# Patient Record
Sex: Female | Born: 1965 | Race: Black or African American | Hispanic: No | Smoking: Former smoker
Health system: Southern US, Community
[De-identification: ages and names within clinical notes are randomized; demographics above are authoritative.]

## PROBLEM LIST (undated history)

## (undated) DIAGNOSIS — I10 Essential (primary) hypertension: Secondary | ICD-10-CM

## (undated) DIAGNOSIS — M539 Dorsopathy, unspecified: Secondary | ICD-10-CM

## (undated) DIAGNOSIS — H8390 Unspecified disease of inner ear, unspecified ear: Secondary | ICD-10-CM

## (undated) DIAGNOSIS — T753XXA Motion sickness, initial encounter: Secondary | ICD-10-CM

## (undated) DIAGNOSIS — C801 Malignant (primary) neoplasm, unspecified: Secondary | ICD-10-CM

## (undated) HISTORY — DX: Malignant (primary) neoplasm, unspecified: C80.1

## (undated) HISTORY — PX: TUBAL LIGATION: SHX77

---

## 2006-01-31 ENCOUNTER — Emergency Department: Payer: Self-pay | Admitting: Emergency Medicine

## 2006-02-17 ENCOUNTER — Emergency Department: Payer: Self-pay | Admitting: Emergency Medicine

## 2006-09-03 ENCOUNTER — Emergency Department: Payer: Self-pay | Admitting: Emergency Medicine

## 2010-07-24 ENCOUNTER — Emergency Department: Payer: Self-pay | Admitting: Emergency Medicine

## 2012-12-07 ENCOUNTER — Emergency Department: Payer: Self-pay | Admitting: Emergency Medicine

## 2014-03-16 ENCOUNTER — Emergency Department: Payer: Self-pay | Admitting: Emergency Medicine

## 2014-03-16 LAB — BASIC METABOLIC PANEL
Anion Gap: 7 (ref 7–16)
BUN: 8 mg/dL (ref 7–18)
Calcium, Total: 8.9 mg/dL (ref 8.5–10.1)
Chloride: 106 mmol/L (ref 98–107)
Co2: 25 mmol/L (ref 21–32)
Creatinine: 0.8 mg/dL (ref 0.60–1.30)
EGFR (African American): >60
EGFR (Non-African Amer.): >60
Glucose: 135 mg/dL — ABNORMAL HIGH (ref 65–99)
Osmolality: 276 (ref 275–301)
Potassium: 3.6 mmol/L (ref 3.5–5.1)
Sodium: 138 mmol/L (ref 136–145)

## 2014-03-16 LAB — CBC
HCT: 39.2 % (ref 35.0–47.0)
HGB: 12.9 g/dL (ref 12.0–16.0)
MCH: 32.1 pg (ref 26.0–34.0)
MCHC: 33 g/dL (ref 32.0–36.0)
MCV: 97 fL (ref 80–100)
Platelet: 274 10*3/uL (ref 150–440)
RBC: 4.04 10*6/uL (ref 3.80–5.20)
RDW: 13.7 % (ref 11.5–14.5)
WBC: 7.4 10*3/uL (ref 3.6–11.0)

## 2015-01-30 ENCOUNTER — Ambulatory Visit
Admission: EM | Admit: 2015-01-30 | Discharge: 2015-01-30 | Disposition: A | Discharge status: Home / Self Care | Payer: Self-pay | Attending: Internal Medicine | Admitting: Internal Medicine

## 2015-01-30 DIAGNOSIS — Z202 Contact with and (suspected) exposure to infections with a predominantly sexual mode of transmission: Secondary | ICD-10-CM | POA: Insufficient documentation

## 2015-01-30 DIAGNOSIS — Z79899 Other long term (current) drug therapy: Secondary | ICD-10-CM | POA: Insufficient documentation

## 2015-01-30 DIAGNOSIS — I10 Essential (primary) hypertension: Secondary | ICD-10-CM | POA: Insufficient documentation

## 2015-01-30 DIAGNOSIS — F17200 Nicotine dependence, unspecified, uncomplicated: Secondary | ICD-10-CM | POA: Insufficient documentation

## 2015-01-30 DIAGNOSIS — Z0000 Encounter for general adult medical examination without abnormal findings: Secondary | ICD-10-CM

## 2015-01-30 DIAGNOSIS — Z8489 Family history of other specified conditions: Secondary | ICD-10-CM | POA: Insufficient documentation

## 2015-01-30 HISTORY — DX: Essential (primary) hypertension: I10

## 2015-01-30 HISTORY — DX: Unspecified disease of inner ear, unspecified ear: H83.90

## 2015-01-30 LAB — CHLAMYDIA/NGC RT PCR (ARMC ONLY)
Chlamydia Tr: NOT DETECTED
N gonorrhoeae: NOT DETECTED

## 2015-01-30 MED ORDER — AZITHROMYCIN 500 MG PO TABS: 1000.0000 mg | ORAL_TABLET | Freq: Once | ORAL | Status: DC

## 2015-01-30 MED ORDER — CEFTRIAXONE SODIUM 250 MG IJ SOLR: 250.0000 mg | Freq: Once | INTRAMUSCULAR | Status: DC

## 2015-01-30 NOTE — ED Notes (Signed)
States boyfriend told patient this morning that had penile discharge and "she needed to get checked". Patient denies any discharge, no symptoms.

## 2015-01-30 NOTE — Discharge Instructions (Signed)
Tests for gonorrhea and chlamydia (urine test) and hepatitis B, hepatitis C, HIV, syphilis (blood test) were done today.  Some of the results will take a few days to come back, others will be back in the next 24 hours.

## 2015-01-30 NOTE — ED Provider Notes (Signed)
CSN: [id number]     Arrival date & time 01/30/15  6606 History   First MD Initiated Contact with Patient 01/30/15 [phone]     Chief Complaint  Patient presents with  . Exposure to STD   HPI Patient is a 49 year old lady with no symptoms. She presents today with a history that her boyfriend has a penile discharge, and told her that she needed to be tested for STDs. No abdominal or pelvic pain, no dysuria, no urinary frequency. No unusual vaginal bleeding or discharge. No change in bowel movements. No rash/skin changes, no unusual joint pains or achiness. No fever  Past Medical History  Diagnosis Date  . Hypertension   . Inner ear disease    History reviewed. No pertinent past surgical history. Family History  Problem Relation Age of Onset  . Cirrhosis Father    History  Substance Use Topics  . Smoking status: Current Every Day Smoker  . Smokeless tobacco: Not on file  . Alcohol Use: Yes     Comment: socially   OB History    No data available     Review of Systems  All other systems reviewed and are negative.   Allergies  Review of patient's allergies indicates no known allergies.  Home Medications   Prior to Admission medications   Medication Sig Start Date End Date Taking? Authorizing Provider  lisinopril-hydrochlorothiazide (PRINZIDE,ZESTORETIC) 10-12.5 MG per tablet Take 1 tablet by mouth daily.   Yes Historical Provider, MD                 BP 185/104 mmHg  Pulse 94  Temp(Src) 98.2 F (36.8 C) (Oral)  Resp 16  Ht 5\' 2"  (1.575 m)  Wt 147 lb (66.679 kg)  BMI 26.88 kg/m2  SpO2 100%  LMP 01/25/2015 (Exact Date) Physical Exam  Constitutional: She is oriented to person, place, and time. No distress.  Alert, nicely groomed  HENT:  Head: Atraumatic.  Eyes:  Conjugate gaze, no eye redness/drainage  Neck: Neck supple.  Cardiovascular: Normal rate.   Pulmonary/Chest: No respiratory distress.  Abdominal: She exhibits no distension.  Musculoskeletal: Normal range  of motion.  No leg swelling  Neurological: She is alert and oriented to person, place, and time.  Skin: Skin is warm and dry.  No cyanosis  Nursing note and vitals reviewed.   ED Course  Procedures   Labs Review Labs Reviewed  CHLAMYDIA/NGC RT PCR ([facility] ONLY)  HIV ANTIBODY (ROUTINE TESTING)  HEPATITIS C ANTIBODY  RPR  HEPATITIS B SURFACE ANTIGEN        MDM   1. Possible exposure to STD     Results pending for GC/chlamydia, Hep B/C, rpr, HIV.    Sherlene Shams, MD 01/30/15 1057

## 2015-01-31 LAB — HIV ANTIBODY (ROUTINE TESTING W REFLEX): HIV Screen 4th Generation wRfx: NONREACTIVE

## 2015-01-31 LAB — RPR: RPR Ser Ql: NONREACTIVE

## 2015-01-31 LAB — HEPATITIS C ANTIBODY: HCV Ab: 0.2 s/co ratio (ref 0.0–0.9)

## 2015-01-31 LAB — HEPATITIS B SURFACE ANTIGEN: Hepatitis B Surface Ag: NEGATIVE

## 2015-06-06 ENCOUNTER — Ambulatory Visit: Payer: Self-pay | Admitting: Family Medicine

## 2017-01-28 ENCOUNTER — Other Ambulatory Visit: Payer: Self-pay | Admitting: Family Medicine

## 2017-01-28 DIAGNOSIS — N6459 Other signs and symptoms in breast: Secondary | ICD-10-CM

## 2017-02-06 ENCOUNTER — Ambulatory Visit
Admission: RE | Admit: 2017-02-06 | Discharge: 2017-02-06 | Disposition: A | Discharge status: Home / Self Care | Payer: BLUE CROSS/BLUE SHIELD | Source: Ambulatory Visit | Attending: Family Medicine | Admitting: Family Medicine

## 2017-02-06 ENCOUNTER — Other Ambulatory Visit: Payer: Self-pay

## 2017-02-06 DIAGNOSIS — N6459 Other signs and symptoms in breast: Secondary | ICD-10-CM | POA: Diagnosis present

## 2017-02-06 DIAGNOSIS — N63 Unspecified lump in unspecified breast: Secondary | ICD-10-CM | POA: Diagnosis not present

## 2017-02-06 DIAGNOSIS — R21 Rash and other nonspecific skin eruption: Secondary | ICD-10-CM | POA: Insufficient documentation

## 2017-10-06 DIAGNOSIS — I10 Essential (primary) hypertension: Secondary | ICD-10-CM | POA: Diagnosis not present

## 2018-09-01 ENCOUNTER — Emergency Department
Admission: EM | Admit: 2018-09-01 | Discharge: 2018-09-01 | Disposition: A | Discharge status: Home / Self Care | Payer: BLUE CROSS/BLUE SHIELD | Attending: Emergency Medicine | Admitting: Emergency Medicine

## 2018-09-01 ENCOUNTER — Encounter: Payer: Self-pay | Admitting: Emergency Medicine

## 2018-09-01 ENCOUNTER — Other Ambulatory Visit: Payer: Self-pay

## 2018-09-01 DIAGNOSIS — J069 Acute upper respiratory infection, unspecified: Secondary | ICD-10-CM | POA: Diagnosis not present

## 2018-09-01 DIAGNOSIS — I10 Essential (primary) hypertension: Secondary | ICD-10-CM | POA: Diagnosis not present

## 2018-09-01 DIAGNOSIS — Z79899 Other long term (current) drug therapy: Secondary | ICD-10-CM | POA: Insufficient documentation

## 2018-09-01 DIAGNOSIS — R0981 Nasal congestion: Secondary | ICD-10-CM | POA: Diagnosis not present

## 2018-09-01 DIAGNOSIS — Z87891 Personal history of nicotine dependence: Secondary | ICD-10-CM | POA: Diagnosis not present

## 2018-09-01 LAB — INFLUENZA PANEL BY PCR (TYPE A & B)
Influenza A By PCR: NEGATIVE
Influenza B By PCR: NEGATIVE

## 2018-09-01 MED ORDER — AMOXICILLIN 875 MG PO TABS: 875.0000 mg | ORAL_TABLET | Freq: Two times a day (BID) | ORAL | 0 refills | Status: DC

## 2018-09-01 MED ORDER — FLUTICASONE PROPIONATE 50 MCG/ACT NA SUSP: 2.0000 | Freq: Every day | NASAL | 2 refills | Status: DC

## 2018-09-01 MED ORDER — PREDNISONE 10 MG PO TABS: 30.0000 mg | ORAL_TABLET | Freq: Every day | ORAL | 0 refills | Status: DC

## 2018-09-01 NOTE — ED Notes (Signed)
See triage note  Presents with sinus pressure and drainage  States sx's started yesterday  Unsure of fever but has had body aches  Afebrile on arrival

## 2018-09-01 NOTE — ED Provider Notes (Signed)
[facility] Emergency Department Provider Note  ____________________________________________   First MD Initiated Contact with Patient 09/01/18 0715     (approximate)  I have reviewed the triage vital signs and the nursing notes.   HISTORY  Chief Complaint Nasal Congestion    HPI Tammy Garcia is a 53 y.o. femaleflulike symptoms, patient is complained of feeling hot , chills, body aches, cough, sore throat, states she has nausea but no vomiting, denies diarrhea; denies chest pain or sob.  Sx for 1 days.  Patient works at assisted living facility.  She states they just came off quarantine for norovirus.   Past Medical History:  Diagnosis Date  . Hypertension   . Inner ear disease     There are no active problems to display for this patient.   History reviewed. No pertinent surgical history.  Prior to Admission medications   Medication Sig Start Date End Date Taking? Authorizing Provider  atenolol (TENORMIN) 50 MG tablet Take 50 mg by mouth daily.   Yes [provider]  chlorthalidone (HYGROTON) 25 MG tablet Take 25 mg by mouth daily.   Yes [provider]  amoxicillin (AMOXIL) 875 MG tablet Take 1 tablet (875 mg total) by mouth 2 (two) times daily. 09/01/18   Fisher, Linden Dolin, PA-C  fluticasone (FLONASE) 50 MCG/ACT nasal spray Place 2 sprays into both nostrils daily. 09/01/18   Caryn Section Linden Dolin, PA-C  predniSONE (DELTASONE) 10 MG tablet Take 3 tablets (30 mg total) by mouth daily with breakfast. 09/01/18   Caryn Section Linden Dolin, PA-C    Allergies Patient has no known allergies.  Family History  Problem Relation Age of Onset  . Cirrhosis Father   . Breast cancer Neg Hx     Social History Social History   Tobacco Use  . Smoking status: Former Research scientist (life sciences)  . Smokeless tobacco: Never Used  Substance Use Topics  . Alcohol use: Yes    Comment: socially  . Drug use: Not on file    Review of Systems  Constitutional: Positive feeling  hot/chills Eyes: No visual changes. ENT: No sore throat. Respiratory: Positive cough Genitourinary: Negative for dysuria. Musculoskeletal: Negative for back pain. Skin: Negative for rash.    ____________________________________________   PHYSICAL EXAM:  VITAL SIGNS: ED Triage Vitals  Enc Vitals Group     BP 09/01/18 0648 (!) 150/87     Pulse Rate 09/01/18 0648 85     Resp 09/01/18 0648 20     Temp 09/01/18 0648 98.6 F (37 C)     Temp Source 09/01/18 0648 Oral     SpO2 09/01/18 0648 98 %     Weight 09/01/18 0647 148 lb (67.1 kg)     Height 09/01/18 0647 5\' 2"  (1.575 m)     Head Circumference --      Peak Flow --      Pain Score 09/01/18 0647 5     Pain Loc --      Pain Edu? --      Excl. in [place]? --     Constitutional: Alert and oriented. Well appearing and in no acute distress. Eyes: Conjunctivae are normal.  Head: Atraumatic. Ears: normal tms b/l Nose: No congestion/rhinnorhea.  Nasal mucosa is red and swollen Mouth/Throat: Mucous membranes are moist.   Neck:  supple no lymphadenopathy noted Cardiovascular: Normal rate, regular rhythm. Heart sounds are normal Respiratory: Normal respiratory effort.  No retractions, lungs c t a   Abdomen: Nontender bowel sounds normal GU: deferred  Musculoskeletal: FROM all extremities, warm and well perfused Neurologic:  Normal speech and language.  Skin:  Skin is warm, dry and intact. No rash noted. Psychiatric: Mood and affect are normal. Speech and behavior are normal.  ____________________________________________   LABS (all labs ordered are listed, but only abnormal results are displayed)  Labs Reviewed  INFLUENZA PANEL BY PCR (TYPE A & B)   ____________________________________________   ____________________________________________  RADIOLOGY    ____________________________________________   PROCEDURES  Procedure(s) performed: No  Procedures    ____________________________________________   INITIAL  IMPRESSION / ASSESSMENT AND PLAN / ED COURSE  Pertinent labs & imaging results that were available during my care of the patient were reviewed by me and considered in my medical decision making (see chart for details).   Patient is a 53 year old female presents emergency department with URI symptoms.  She works in an assisted living facility.  She states she is unsure if she has had fever but feels hot and cold along with chills.  She has been sweating while in the emergency department.  Physical exam shows an afebrile female.  She does feel clammy to touch.  Nasal mucosa is red and swollen, voice is hoarse, remainder the exam is unremarkable  Flu swab secondary to the patient working in assisted living.    ----------------------------------------- 8:56 AM on 09/01/2018 -----------------------------------------  Flu swab is negative.  Discussed the flu test results with the patient.  She was given a prescription for amoxicillin, prednisone 30 mg daily for 3 days, Flonase.  She is to follow-up with regular doctor if not better in 3 days.  Return emergency department if worsening.  She was given a work note stating that she should be out of work today, tomorrow if not better.  She was discharged in stable condition.  As part of my medical decision making, I reviewed the following data within the [facility] notes reviewed and incorporated, Labs reviewed influenza swab is negative, Notes from prior ED visits and [place] Controlled Substance Database  ____________________________________________   FINAL CLINICAL IMPRESSION(S) / ED DIAGNOSES  Final diagnoses:  Acute URI      NEW MEDICATIONS STARTED DURING THIS VISIT:  New Prescriptions   AMOXICILLIN (AMOXIL) 875 MG TABLET    Take 1 tablet (875 mg total) by mouth 2 (two) times daily.   FLUTICASONE (FLONASE) 50 MCG/ACT NASAL SPRAY    Place 2 sprays into both nostrils daily.   PREDNISONE (DELTASONE) 10 MG TABLET    Take  3 tablets (30 mg total) by mouth daily with breakfast.     Note:  This document was prepared using Dragon voice recognition software and may include unintentional dictation errors.    Versie Starks, PA-C 09/01/18 0857    Eula Listen, MD 09/01/18 [phone]

## 2018-09-01 NOTE — ED Triage Notes (Signed)
Patient ambulatory to triage with steady gait, without difficulty or distress noted; pt reports sinus drainage since yesterday with some body aches and ear pressure, st "I think I have a sinus infection"; st hx of same

## 2018-09-01 NOTE — Discharge Instructions (Addendum)
Follow-up with your regular doctor if not better in 3 to 5 days.  Return emergency department worsening.  Use medications as prescribed.

## 2018-10-08 DIAGNOSIS — I10 Essential (primary) hypertension: Secondary | ICD-10-CM | POA: Diagnosis not present

## 2018-10-08 DIAGNOSIS — J019 Acute sinusitis, unspecified: Secondary | ICD-10-CM | POA: Diagnosis not present

## 2018-12-14 ENCOUNTER — Ambulatory Visit (INDEPENDENT_AMBULATORY_CARE_PROVIDER_SITE_OTHER): Payer: BLUE CROSS/BLUE SHIELD | Admitting: Family Medicine

## 2018-12-14 ENCOUNTER — Encounter: Payer: Self-pay | Admitting: Family Medicine

## 2018-12-14 DIAGNOSIS — Z1159 Encounter for screening for other viral diseases: Secondary | ICD-10-CM | POA: Diagnosis not present

## 2018-12-14 DIAGNOSIS — R7309 Other abnormal glucose: Secondary | ICD-10-CM | POA: Diagnosis not present

## 2018-12-14 DIAGNOSIS — R454 Irritability and anger: Secondary | ICD-10-CM

## 2018-12-14 DIAGNOSIS — L600 Ingrowing nail: Secondary | ICD-10-CM

## 2018-12-14 DIAGNOSIS — Z1211 Encounter for screening for malignant neoplasm of colon: Secondary | ICD-10-CM

## 2018-12-14 DIAGNOSIS — Z13220 Encounter for screening for lipoid disorders: Secondary | ICD-10-CM

## 2018-12-14 DIAGNOSIS — I10 Essential (primary) hypertension: Secondary | ICD-10-CM

## 2018-12-14 DIAGNOSIS — B351 Tinea unguium: Secondary | ICD-10-CM

## 2018-12-14 DIAGNOSIS — Z1239 Encounter for other screening for malignant neoplasm of breast: Secondary | ICD-10-CM

## 2018-12-14 DIAGNOSIS — Z1212 Encounter for screening for malignant neoplasm of rectum: Secondary | ICD-10-CM

## 2018-12-14 DIAGNOSIS — N951 Menopausal and female climacteric states: Secondary | ICD-10-CM

## 2018-12-14 MED ORDER — CICLOPIROX 8 % EX SOLN: Freq: Every day | CUTANEOUS | 0 refills | Status: DC

## 2018-12-14 MED ORDER — VENLAFAXINE HCL 37.5 MG PO TABS: ORAL_TABLET | ORAL | 1 refills | Status: DC

## 2018-12-14 MED ORDER — BUSPIRONE HCL 5 MG PO TABS: ORAL_TABLET | ORAL | 1 refills | Status: DC

## 2018-12-14 MED ORDER — DOXYCYCLINE HYCLATE 100 MG PO TABS: 100.0000 mg | ORAL_TABLET | Freq: Two times a day (BID) | ORAL | 0 refills | Status: AC

## 2018-12-14 NOTE — Progress Notes (Signed)
Name: Tammy Garcia   MRN: [id number]    DOB: 1966/02/07   Date:12/14/2018       Progress Note  Subjective  Chief Complaint  Chief Complaint  Patient presents with  . New Patient (Initial Visit)  . Toe Pain    right pinky toe possible corn    I connected with  [name]  on 12/14/18 at  1:00 PM EDT by a video enabled telemedicine application and verified that I am speaking with the correct person using two identifiers.  I discussed the limitations of evaluation and management by telemedicine and the availability of in person appointments. The patient expressed understanding and agreed to proceed. Staff also discussed with the patient that there may be a patient responsible charge related to this service. 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  There is no drainage.  She had similar issue with the LEFT side and she used medicated corn pad that took care of things.  Anxiety and Insomnia: Has never really had an issue with anxiety or insomnia for a few months.  She has not issues falling asleep, but has been waking up after 3-4 hours and can't go back to sleep because she is feeling anxious and negative emotions.  She has never gone to counseling.  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  Perimenopause: She has been having periods that are starting to become more irregular.  She is having hotflashes, irritability, and intermittent insomnia.   HTN: Taking atenolol 50mg  and chlorthalidone 25mg .  Checking at home sometimes - 136/72.  Denies chest pain, shortness of breath, BLE edema, orthopnea, headaches, vision changes.   Hyperglycemia: Noted on BMP from 2015 - will recheck in labs  HM: Needs Mammogram, pap, TDAP, colonoscopy, Hep C screening; will schedule CPE w/ Pap when able to do so in the context of COVID-19 pandemic.  There are no active problems to display for this patient.   Past Surgical History:  Procedure Laterality Date  . TUBAL LIGATION      Family History  Problem Relation Age of Onset  . Cirrhosis Father   . Hyperlipidemia Sister   . Diabetes Maternal Aunt   . Kidney disease Maternal Aunt   . Hypertension Maternal Aunt   . Breast cancer Neg Hx     Social History   Socioeconomic History  . Marital status: Significant Other    Spouse name: Not on file  . Number of children: 3  . Years of education: 47  . Highest education level: Not on file  Occupational History  . Not on file  Social Needs  . Financial resource strain: Not hard at all  . Food insecurity:    Worry: Never true    Inability: Never true  . Transportation needs:    Medical: No    Non-medical: No  Tobacco Use  . Smoking status: Former Research scientist (life sciences)  . Smokeless tobacco: Never Used  Substance and Sexual Activity  . Alcohol use: Yes  . Drug use: Yes    Types: Marijuana  . Sexual activity: Yes  Lifestyle  . Physical activity:  Days per week: 0 days    Minutes per session: 0 min  . Stress: Not at all  Relationships  . Social connections:    Talks on phone: More than three times a week    Gets together: Once a week    Attends religious service: Never    Active member of club or organization: No    Attends meetings of clubs or organizations: Never    Relationship status: Living with partner  . Intimate partner violence:    Fear of current or ex partner: No    Emotionally abused: No    Physically abused: No    Forced sexual activity: No  Other Topics Concern  . Not on file  Social History Narrative  . Not on file     Current Outpatient Medications:  .  atenolol (TENORMIN) 50 MG tablet, Take 50 mg  by mouth daily., Disp: , Rfl:  .  chlorthalidone (HYGROTON) 25 MG tablet, Take 25 mg by mouth daily., Disp: , Rfl:  .  amoxicillin (AMOXIL) 875 MG tablet, Take 1 tablet (875 mg total) by mouth 2 (two) times daily. (Patient not taking: Reported on 12/14/2018), Disp: 20 tablet, Rfl: 0 .  fluticasone (FLONASE) 50 MCG/ACT nasal spray, Place 2 sprays into both nostrils daily. (Patient not taking: Reported on 12/14/2018), Disp: 16 g, Rfl: 2 .  predniSONE (DELTASONE) 10 MG tablet, Take 3 tablets (30 mg total) by mouth daily with breakfast. (Patient not taking: Reported on 12/14/2018), Disp: 9 tablet, Rfl: 0  No Known Allergies  I personally reviewed active problem list, medication list, allergies, health maintenance, lab results with the patient/caregiver today.   ROS  Constitutional: Negative for fever or weight change.  Respiratory: Negative for cough and shortness of breath.   Cardiovascular: Negative for chest pain or palpitations.  Gastrointestinal: Negative for abdominal pain, no bowel changes.  Musculoskeletal: Negative for gait problem or joint swelling.  Skin: Negative for rash. See HPI Neurological: Negative for dizziness or headache.  No other specific complaints in a complete review of systems (except as listed in HPI above).  Objective  Virtual encounter, vitals not obtained.  There is no height or weight on file to calculate BMI.  Physical Exam  Constitutional: Patient appears well-developed and well-nourished. No distress.  HENT: Head: Normocephalic and atraumatic.  Neck: Normal range of motion. Pulmonary/Chest: Effort normal. No respiratory distress. Speaking in complete sentences Neurological: Pt is alert and oriented to person, place, and time. Coordination, speech and gait are normal.  Psychiatric: Patient has a normal mood and affect. behavior is normal. Judgment and thought content normal. Skin: RIGHT lateral aspect of the 5th toe is swollen and erythematous, the nail is  hyperpigmented, no appreciable abnormal warmth or coolness noted by the patient, no drainage.  No results found for this or any previous visit (from the past 72 hour(s)).  PHQ2/9: Depression screen PHQ 2/9 12/14/2018  Decreased Interest 1  Down, Depressed, Hopeless 1  PHQ - 2 Score 2  Altered sleeping 3  Tired, decreased energy 2  Change in appetite 0  Feeling bad or failure about yourself  0  Trouble concentrating 0  Moving slowly or fidgety/restless 0  Suicidal thoughts 0  PHQ-9 Score 7  Difficult doing work/chores Not difficult at all   PHQ-2/9 Result is positive.   GAD 7 : Generalized Anxiety Score 12/14/2018  Nervous, Anxious, on Edge 1  Control/stop worrying 1  Worry too much - different things 1  Trouble relaxing 0  Restless 0  Easily annoyed or irritable 1  Afraid - awful might happen 1  Total GAD 7 Score 5  Anxiety Difficulty Not difficult at all     Fall Risk: Fall Risk  12/14/2018  Falls in the past year? 0  Number falls in past yr: 0  Injury with Fall? 0    Assessment & Plan  1. Essential hypertension - Comprehensive metabolic panel - continue Current medicaiton regimen; home BP's have shown adequate control.  2. Lipid screening - Lipid panel  3. Need for hepatitis C screening test - Hepatitis C antibody  4. Elevated glucose - Comprehensive metabolic panel  5. Breast cancer screening - MM 3D SCREEN BREAST BILATERAL; Future  6. Encounter for colorectal cancer screening - Ambulatory referral to Gastroenterology  7. Onychomycosis with ingrown toenail - doxycycline (VIBRA-TABS) 100 MG tablet; Take 1 tablet (100 mg total) by mouth 2 (two) times daily for 7 days.  Dispense: 14 tablet; Refill: 0 - ciclopirox (PENLAC) 8 % solution; Apply topically at bedtime. Apply over nail and surrounding skin. Apply daily over previous coat. After seven (7) days, may remove with alcohol and continue cycle.  Dispense: 6.6 mL; Refill: 0  8. Perimenopausal vasomotor  symptoms - venlafaxine (EFFEXOR) 37.5 MG tablet; Take 1 tablet once daily x7 days, then increase to 1 tablet twice daily  Dispense: 60 tablet; Refill: 1  9. Irritability - venlafaxine (EFFEXOR) 37.5 MG tablet; Take 1 tablet once daily x7 days, then increase to 1 tablet twice daily  Dispense: 60 tablet; Refill: 1 - busPIRone (BUSPAR) 5 MG tablet; Take 1 tablet at night; after 7 days, may increase to 1 tablet twice daily  Dispense: 60 tablet; Refill: 1  I discussed the assessment and treatment plan with the patient. The patient was provided an opportunity to ask questions and all were answered. The patient agreed with the plan and demonstrated an understanding of the instructions.  The patient was advised to call back or seek an in-person evaluation if the symptoms worsen or if the condition fails to improve as anticipated.  I provided 31 minutes of non-face-to-face time during this encounter.

## 2019-01-06 ENCOUNTER — Ambulatory Visit: Payer: BLUE CROSS/BLUE SHIELD | Admitting: Family Medicine

## 2019-01-11 ENCOUNTER — Encounter: Payer: Self-pay | Admitting: *Deleted

## 2019-01-11 DIAGNOSIS — Z13220 Encounter for screening for lipoid disorders: Secondary | ICD-10-CM | POA: Diagnosis not present

## 2019-01-11 DIAGNOSIS — R7309 Other abnormal glucose: Secondary | ICD-10-CM | POA: Diagnosis not present

## 2019-01-11 DIAGNOSIS — I10 Essential (primary) hypertension: Secondary | ICD-10-CM | POA: Diagnosis not present

## 2019-01-11 DIAGNOSIS — Z1159 Encounter for screening for other viral diseases: Secondary | ICD-10-CM | POA: Diagnosis not present

## 2019-01-12 LAB — COMPREHENSIVE METABOLIC PANEL
AG Ratio: 1.5 (calc) (ref 1.0–2.5)
ALT: 13 U/L (ref 6–29)
AST: 14 U/L (ref 10–35)
Albumin: 4 g/dL (ref 3.6–5.1)
Alkaline phosphatase (APISO): 65 U/L (ref 37–153)
BUN: 14 mg/dL (ref 7–25)
CO2: 25 mmol/L (ref 20–32)
Calcium: 9 mg/dL (ref 8.6–10.4)
Chloride: 106 mmol/L (ref 98–110)
Creat: 0.78 mg/dL (ref 0.50–1.05)
Globulin: 2.6 g/dL (calc) (ref 1.9–3.7)
Glucose, Bld: 82 mg/dL (ref 65–99)
Potassium: 3.8 mmol/L (ref 3.5–5.3)
Sodium: 138 mmol/L (ref 135–146)
Total Bilirubin: 0.3 mg/dL (ref 0.2–1.2)
Total Protein: 6.6 g/dL (ref 6.1–8.1)

## 2019-01-12 LAB — LIPID PANEL
Cholesterol: 213 mg/dL — ABNORMAL HIGH (ref <200)
HDL: 61 mg/dL (ref >=50)
LDL Cholesterol (Calc): 133 mg/dL (calc) — ABNORMAL HIGH
Non-HDL Cholesterol (Calc): 152 mg/dL (calc) — ABNORMAL HIGH (ref <130)
Total CHOL/HDL Ratio: 3.5 (calc) (ref <5.0)
Triglycerides: 89 mg/dL (ref <150)

## 2019-01-12 LAB — HEPATITIS C ANTIBODY
Hepatitis C Ab: NONREACTIVE
SIGNAL TO CUT-OFF: 0.01 (ref <1.00)

## 2019-01-15 ENCOUNTER — Ambulatory Visit: Payer: BLUE CROSS/BLUE SHIELD | Admitting: Family Medicine

## 2019-02-22 ENCOUNTER — Other Ambulatory Visit: Payer: Self-pay | Admitting: Family Medicine

## 2019-02-22 ENCOUNTER — Telehealth: Payer: Self-pay | Admitting: Family Medicine

## 2019-02-22 MED ORDER — CHLORTHALIDONE 25 MG PO TABS: 25.0000 mg | ORAL_TABLET | Freq: Every day | ORAL | 0 refills | Status: DC

## 2019-02-22 NOTE — Telephone Encounter (Signed)
Pt has virtual appt scheduled with Raquel Sarna for this coming Monday 7.20.20. she is completely out of her bp medication. Is it possible to call in enough to last until her appt. Please send to [email or web address]

## 2019-02-23 ENCOUNTER — Encounter: Payer: Self-pay | Admitting: Emergency Medicine

## 2019-02-23 ENCOUNTER — Ambulatory Visit (INDEPENDENT_AMBULATORY_CARE_PROVIDER_SITE_OTHER): Payer: BC Managed Care – PPO

## 2019-02-23 ENCOUNTER — Other Ambulatory Visit: Payer: Self-pay

## 2019-02-23 ENCOUNTER — Ambulatory Visit
Admission: EM | Admit: 2019-02-23 | Discharge: 2019-02-23 | Disposition: A | Discharge status: Home / Self Care | Payer: BC Managed Care – PPO | Attending: Urgent Care | Admitting: Urgent Care

## 2019-02-23 DIAGNOSIS — M7700 Medial epicondylitis, unspecified elbow: Secondary | ICD-10-CM | POA: Diagnosis not present

## 2019-02-23 DIAGNOSIS — M7702 Medial epicondylitis, left elbow: Secondary | ICD-10-CM | POA: Diagnosis not present

## 2019-02-23 DIAGNOSIS — M25522 Pain in left elbow: Secondary | ICD-10-CM | POA: Diagnosis not present

## 2019-02-23 MED ORDER — METHYLPREDNISOLONE 4 MG PO TBPK: ORAL_TABLET | ORAL | 0 refills | Status: DC

## 2019-02-23 NOTE — ED Triage Notes (Signed)
Patient states her left arm is very painful at the inside elbow.  Patient states her elbow is painful to touch

## 2019-02-23 NOTE — Discharge Instructions (Signed)
It was very nice seeing you today in clinic. Thank you for entrusting me with your care.   As discussed, your ultrasound was NEGATIVE for a clot. You pain seems to be musculoskeletal in nature. Plans for treating you are as follows:  Please utilize the medications (steroids) that we discussed. Your prescriptions have been called in to your pharmacy.  May use Tylenol and/or Ibuprofen as needed for pain. Avoid overdoing it, but you need to make efforts to remain active as tolerated.  Avoiding activity all together can make your pain worse. You may find that alternating between ice and moist heat application will help with your pain.  Heat/ice should be applied for 10-15 minutes at a time at least 3-4 times a day.  Make arrangements to follow up with your regular doctor in 1 week for re-evaluation. If your symptoms/condition worsens, please seek follow up care either here or in the ER. Please remember, our [place] providers are "right here with you" when you need [place].   Again, it was my pleasure to take care of you today. Thank you for choosing our clinic. I hope that you start to feel better quickly.   Honor Loh, MSN, APRN, FNP-C, CEN Advanced Practice Provider [facility]

## 2019-02-23 NOTE — ED Provider Notes (Signed)
[place], [place]   Name: Tammy Garcia DOB: February 29, 1966 MRN: [id number] CSN: [id number] PCP: Hubbard Hartshorn, FNP  Arrival date and time:  02/23/19 1111  Chief Complaint:  Arm Pain   NOTE: Prior to seeing the patient today, I have reviewed the triage nursing documentation and vital signs. Clinical staff has updated patient's PMH/PSHx, current medication list, and drug allergies/intolerances to ensure comprehensive history available to assist in medical decision making.   History:   HPI: Tammy Garcia is a 53 y.o. female who presents today with complaints of pain toher LEFT elbow. She describes the pain as having an ulnar distribution. Pain has been worsening over the course of the last 2 days. Patient denies injury. She notes painful AROM. Patient denies heavy lifting or an occupation that requires her to perform repetitive movements. She denies focal weakness and distal paraesthesias. Patient presents today with concerns of having a venous thrombus in her upper arm.   In efforts to conservatively manage her symptoms at home, the patient notes that she has used ibuprofen, which has not helped "much" to improve her symptoms.    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   Hypertension    Inner ear disease     Past Surgical History:  Procedure Laterality Date   TUBAL LIGATION      Family History  Problem Relation Age of Onset   Cirrhosis Father    Hyperlipidemia Sister    Diabetes Maternal Aunt    Kidney disease Maternal Aunt    Hypertension Maternal Aunt    Breast cancer Neg Hx     Social History   Tobacco Use   Smoking status: Former Smoker   Smokeless tobacco: Never Used  Substance Use Topics   Alcohol use: Yes   Drug use: Yes    Types: Marijuana    Patient Active Problem List   Diagnosis Date Noted   Onychomycosis with ingrown toenail 12/14/2018   Perimenopausal vasomotor symptoms 12/14/2018   Hypertensive  disorder 10/08/2018    Home Medications:    Current Meds  Medication Sig   atenolol (TENORMIN) 50 MG tablet Take 50 mg by mouth daily.   chlorthalidone (HYGROTON) 25 MG tablet Take 1 tablet (25 mg total) by mouth daily.    Allergies:   Patient has no known allergies.  Review of Systems (ROS): Review of Systems  Constitutional: Negative for chills and fever.  Respiratory: Negative for cough and shortness of breath.   Cardiovascular: Negative for chest pain and palpitations.  Musculoskeletal: Negative for joint swelling.       Acute LEFT elbow pain     Vital Signs: Today's Vitals   02/23/19 1157 02/23/19 1158 02/23/19 1201 02/23/19 1343  BP:   (!) 137/92   Pulse:   80   Resp:   16   Temp:   98.5 F (36.9 C)   TempSrc:   Oral   SpO2:   100%   Weight:  145 lb (65.8 kg)    Height:  5\' 2"  (1.575 m)    PainSc: 8    8     Physical Exam: Physical Exam  Constitutional: She is oriented to person, place, and time and well-developed, well-nourished, and in no distress.  HENT:  Head: Normocephalic and atraumatic.  Mouth/Throat: Mucous membranes are normal.  Neck: Normal range of motion. No spinous process tenderness and no muscular tenderness present. No tracheal deviation present.  Cardiovascular: Normal rate, regular rhythm, normal  heart sounds and intact distal pulses. Exam reveals no gallop and no friction rub.  No murmur heard. Pulmonary/Chest: Effort normal and breath sounds normal. No respiratory distress. She has no wheezes. She has no rales.  Musculoskeletal:     Left elbow: She exhibits decreased range of motion (significant increase in pain with full extension; grip strength WNL). She exhibits no swelling and no deformity. Tenderness found. Medial epicondyle tenderness noted.       Arms:  Neurological: She is alert and oriented to person, place, and time. Gait normal. GCS score is 15.  Skin: Skin is warm and dry. No rash noted.  Psychiatric: Mood, memory, affect  and judgment normal.  Nursing note and vitals reviewed.   Urgent Care Treatments / Results:   LABS: PLEASE NOTE: all labs that were ordered this encounter are listed, however only abnormal results are displayed. Labs Reviewed - No data to display  EKG: -None  RADIOLOGY: US Venous Img Upper Uni Left  Result Date: 02/23/2019 CLINICAL DATA:  53 year old with pain inferior to the left elbow. EXAM: LEFT UPPER EXTREMITY VENOUS DOPPLER ULTRASOUND TECHNIQUE: Tammy Garcia with graded compression, as well as color Doppler and duplex ultrasound were performed to evaluate the upper extremity deep venous system from the level of the subclavian vein and including the jugular, axillary, basilic, radial, ulnar and upper cephalic vein. Spectral Doppler was utilized to evaluate flow at rest and with distal augmentation maneuvers. COMPARISON:  None. FINDINGS: Contralateral Subclavian Vein: Normal color Doppler flow and respiratory phasicity. Internal Jugular Vein: No evidence of thrombus. Normal compressibility, respiratory phasicity and response to augmentation. Subclavian Vein: No evidence of thrombus. Normal compressibility, respiratory phasicity and response to augmentation. Axillary Vein: No evidence of thrombus. Normal compressibility, respiratory phasicity and response to augmentation. Cephalic Vein: No evidence of thrombus. Normal compressibility, respiratory phasicity and response to augmentation. Basilic Vein: No evidence of thrombus. Normal compressibility, respiratory phasicity and response to augmentation. Brachial Veins: No evidence of thrombus. Normal compressibility, respiratory phasicity and response to augmentation. Radial Veins: No evidence of thrombus. Normal compressibility and color Doppler flow. Ulnar Veins: No evidence of thrombus. Normal compressibility and color Doppler flow. Other Findings: No focal abnormality at the area of concern around the left elbow. IMPRESSION: No evidence of  DVT within the left upper extremity. Electronically Signed   By: Markus Daft M.D.   On: 02/23/2019 13:25    PROCEDURES: Procedures  MEDICATIONS RECEIVED THIS VISIT: Medications - No data to display  PERTINENT CLINICAL COURSE NOTES/UPDATES:   Initial Impression / Assessment and Plan / Urgent Care Course:  Pertinent labs & imaging results that were available during my care of the patient were personally reviewed by me and considered in my medical decision making (see lab/imaging section of note for values and interpretations).  Athalene Kolle is a 53 y.o. female who presents to [facility] today with complaints of Arm Pain  Patient overall well appearing and in no acute distress today in clinic. Exam as per above. US imaging of the LEFT upper extremity reveals no evidence of an acute DVT. Patient reassured. Given the point tenderness overlying the medial epicondyle of the elbow, suspect tendinopathy/tendinitis causing her pain. Patient using anti-inflammatories (ibuprofen) without significant relief. Pain affect sleep quality. Will treat with a course of systemic steroids (methylprednisolone). Patient placed in a sling for comfort. She was encouraged to apply ice TID for at least 10-15 minutes at a time.  If pain not improving with the prescribed interventions, patient may  benefit from referral to orthopedics for further evaluation.  Current clinical condition warrants patient being out of work in order to recover from her current injury/illness. She was provided with the appropriate documentation to provide to her place of employment that will allow for her to RTW on 02/26/2019 with no restrictions.   Discussed follow up with primary care physician in 1 week for re-evaluation. I have reviewed the follow up and strict return precautions for any new or worsening symptoms. Patient is aware of symptoms that would be deemed urgent/emergent, and would thus require further evaluation either here or  in the emergency department. At the time of discharge, she verbalized understanding and consent with the discharge plan as it was reviewed with her. All questions were fielded by provider and/or clinic staff prior to patient discharge.    Final Clinical Impressions / Urgent Care Diagnoses:   Final diagnoses:  Medial epicondylitis of left elbow    New Prescriptions:  [place] Controlled Substance Registry consulted? Not Applicable  Meds ordered this encounter  Medications   methylPREDNISolone (MEDROL DOSEPAK) 4 MG TBPK tablet    Sig: Taper per package instructions.    Dispense:  21 tablet    Refill:  0    Recommended Follow up Care:  Patient encouraged to follow up with the following provider within the specified time frame, or sooner as dictated by the severity of her symptoms. As always, she was instructed that for any urgent/emergent care needs, she should seek care either here or in the emergency department for more immediate evaluation.  Follow-up Information    Hubbard Hartshorn, FNP In 1 week.   Specialty: Family Medicine Why: General reassessment of symptoms if not improving Contact information: [address] [place] [id number] [phone]         NOTE: This note was prepared using Dragon dictation software along with smaller phrase technology. Despite my best ability to proofread, there is the potential that transcriptional errors may still occur from this process, and are completely unintentional.     Karen Kitchens, NP 02/23/19 2324

## 2019-03-01 ENCOUNTER — Ambulatory Visit (INDEPENDENT_AMBULATORY_CARE_PROVIDER_SITE_OTHER): Payer: BC Managed Care – PPO | Admitting: Family Medicine

## 2019-03-01 ENCOUNTER — Other Ambulatory Visit: Payer: Self-pay

## 2019-03-01 ENCOUNTER — Encounter: Payer: Self-pay | Admitting: Family Medicine

## 2019-03-01 DIAGNOSIS — R7309 Other abnormal glucose: Secondary | ICD-10-CM

## 2019-03-01 DIAGNOSIS — B351 Tinea unguium: Secondary | ICD-10-CM

## 2019-03-01 DIAGNOSIS — G4700 Insomnia, unspecified: Secondary | ICD-10-CM

## 2019-03-01 DIAGNOSIS — E7800 Pure hypercholesterolemia, unspecified: Secondary | ICD-10-CM | POA: Diagnosis not present

## 2019-03-01 DIAGNOSIS — L600 Ingrowing nail: Secondary | ICD-10-CM

## 2019-03-01 DIAGNOSIS — I10 Essential (primary) hypertension: Secondary | ICD-10-CM

## 2019-03-01 DIAGNOSIS — M7701 Medial epicondylitis, right elbow: Secondary | ICD-10-CM

## 2019-03-01 MED ORDER — TRAZODONE HCL 50 MG PO TABS: 25.0000 mg | ORAL_TABLET | Freq: Every evening | ORAL | 3 refills | Status: DC | PRN

## 2019-03-01 NOTE — Progress Notes (Signed)
Name: Tammy Garcia   MRN: [id number]    DOB: 1965/09/13   Date:03/01/2019       Progress Note  Subjective  Chief Complaint  Chief Complaint  Patient presents with   Hypertension    medication refill   Insomnia    I connected with  [name] on 03/01/19 at  2:20 PM EDT by telephone and verified that I am speaking with the correct person using two identifiers.  I discussed the limitations, risks, security and privacy concerns of performing an evaluation and management service by telephone and the availability of in person appointments. Staff also discussed with the patient that there may be a patient responsible charge related to this service. Patient Location: Home Provider Location: Office Additional Individuals present: None  HPI  Medial Epicondylitis: Diagnosed on 02/23/2019, doing well and seeing some improvement. Minimal swelling and pain still present, avoiding heavy lifting at work.  HTN: BP at home today 115/88 (Left Arm) 130/90 (Right Arm -does take medications as prescribed - current regimen includes Atenolol and chlorthalidone.  - taking medications as instructed, no medication side effects noted, no TIAs, no chest pain on exertion, no dyspnea on exertion, no swelling of ankles, no palpitations - DASH diet discussed - pt does not follow a low sodium diet.  Onychomycosis: She used penlac and states her RIGHT 5th toe has completely resolved in pain and nail is back to normal.  Anxiety and Insomnia: Has never really had an issue with anxiety or insomnia except for the last several few months.  She does not have issues falling asleep, but has been waking up after 3-4 hours and can't go back to sleep because she is feeling anxious and negative emotions.  She has never gone to counseling.  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  Tried buspar, it made her more anxious.  We will  try Trazodone today.  Hyperglycemia: Noted on BMP from 2015 - fasting glucose was normal at last check. No polydipsia, polyphagia, or polyuria.   HLD: No medication at this time; needs to work on diet and exercise.  No chest pain or shortness of breath.  The 10-year ASCVD risk score Mikey Bussing DC Brooke Bonito., et al., 2013) is: 4.2%   Values used to calculate the score:     Age: 53 years     Sex: Female     Is Non-Hispanic African American: Yes     Diabetic: No     Tobacco smoker: No     Systolic Blood Pressure: 314 mmHg     Is BP treated: Yes     HDL Cholesterol: 61 mg/dL     Total Cholesterol: 213 mg/dL   Patient Active Problem List   Diagnosis Date Noted   Onychomycosis with ingrown toenail 12/14/2018   Perimenopausal vasomotor symptoms 12/14/2018   Hypertensive disorder 10/08/2018    Past Surgical History:  Procedure Laterality Date   TUBAL LIGATION      Family History  Problem Relation Age of Onset   Cirrhosis Father    Hyperlipidemia Sister    Diabetes Maternal Aunt    Kidney disease Maternal Aunt    Hypertension Maternal Aunt    Breast cancer Neg Hx     Social History   Socioeconomic History   Marital status: Significant Other    Spouse name: Not on file   Number of children: 3   Years  of education: 12   Highest education level: Not on file  Occupational History   Not on file  Social Needs   Financial resource strain: Not hard at all   Food insecurity    Worry: Never true    Inability: Never true   Transportation needs    Medical: No    Non-medical: No  Tobacco Use   Smoking status: Former Smoker   Smokeless tobacco: Never Used  Substance and Sexual Activity   Alcohol use: Yes   Drug use: Yes    Types: Marijuana   Sexual activity: Yes  Lifestyle   Physical activity    Days per week: 0 days    Minutes per session: 0 min   Stress: Not at all  Relationships   Social connections    Talks on phone: More than three times a week      Gets together: Once a week    Attends religious service: Never    Active member of club or organization: No    Attends meetings of clubs or organizations: Never    Relationship status: Living with partner   Intimate partner violence    Fear of current or ex partner: No    Emotionally abused: No    Physically abused: No    Forced sexual activity: No  Other Topics Concern   Not on file  Social History Narrative   Not on file     Current Outpatient Medications:    atenolol (TENORMIN) 50 MG tablet, Take 50 mg by mouth daily., Disp: , Rfl:    chlorthalidone (HYGROTON) 25 MG tablet, Take 1 tablet (25 mg total) by mouth daily., Disp: 30 tablet, Rfl: 0   methylPREDNISolone (MEDROL DOSEPAK) 4 MG TBPK tablet, Taper per package instructions., Disp: 21 tablet, Rfl: 0   ciclopirox (PENLAC) 8 % solution, Apply topically at bedtime. Apply over nail and surrounding skin. Apply daily over previous coat. After seven (7) days, may remove with alcohol and continue cycle. (Patient not taking: Reported on 03/01/2019), Disp: 6.6 mL, Rfl: 0  No Known Allergies  I personally reviewed active problem list, medication list, allergies, notes from last encounter, lab results with the patient/caregiver today.   ROS Ten systems reviewed and is negative except as mentioned in HPI  Objective  Virtual encounter, vitals not obtained.  There is no height or weight on file to calculate BMI.  Physical Exam  Pulmonary/Chest: Effort normal. No respiratory distress. Speaking in complete sentences Neurological: Pt is alert and oriented to person, place, and time. Coordination, speech normal Psychiatric: Patient has a normal mood and affect. behavior is normal. Judgment and thought content normal.  No results found for this or any previous visit (from the past 72 hour(s)).  PHQ2/9: Depression screen [facility] 2/9 03/01/2019 12/14/2018  Decreased Interest 0 1  Down, Depressed, Hopeless 0 1  PHQ - 2 Score 0 2   Altered sleeping 0 3  Tired, decreased energy 0 2  Change in appetite 0 0  Feeling bad or failure about yourself  0 0  Trouble concentrating 0 0  Moving slowly or fidgety/restless 0 0  Suicidal thoughts 0 0  PHQ-9 Score 0 7  Difficult doing work/chores Not difficult at all Not difficult at all   PHQ-2/9 Result is negative.    Fall Risk: Fall Risk  03/01/2019 12/14/2018  Falls in the past year? 0 0  Number falls in past yr: 0 0  Injury with Fall? 0 0  Follow up Falls evaluation  completed -    Assessment & Plan  1. Essential hypertension - Continue current regimen  2. Medial epicondylitis of right elbow - Improving, call back if worsening after resuming work.  3. Insomnia, unspecified type - D/C buspar. Try Trazodone low dose - traZODone (DESYREL) 50 MG tablet; Take 0.5-1 tablets (25-50 mg total) by mouth at bedtime as needed for sleep.  Dispense: 30 tablet; Refill: 3  4. Pure hypercholesterolemia - Discussed importance of 150 minutes of physical activity weekly, eat two servings of fish weekly, eat one serving of tree nuts ( cashews, pistachios, pecans, almonds.Marland Kitchen) every other day, eat 6 servings of fruit/vegetables daily and drink plenty of water and avoid sweet beverages.   5. Onychomycosis with ingrown toenail - Resolved with Penlac  6. Elevated glucose - Stable at last check, asymptomatic.  I discussed the assessment and treatment plan with the patient. The patient was provided an opportunity to ask questions and all were answered. The patient agreed with the plan and demonstrated an understanding of the instructions.   The patient was advised to call back or seek an in-person evaluation if the symptoms worsen or if the condition fails to improve as anticipated.  I provided 22 minutes of non-face-to-face time during this encounter.  Hubbard Hartshorn, FNP

## 2019-06-04 ENCOUNTER — Encounter: Payer: Self-pay | Admitting: Family Medicine

## 2019-06-04 ENCOUNTER — Ambulatory Visit: Payer: BC Managed Care – PPO | Admitting: Family Medicine

## 2019-06-04 ENCOUNTER — Other Ambulatory Visit: Payer: Self-pay

## 2019-06-04 VITALS — BP 118/72 | HR 69 | Temp 97.2°F | Resp 16 | Ht 61.0 in | Wt 139.6 lb

## 2019-06-04 DIAGNOSIS — G4700 Insomnia, unspecified: Secondary | ICD-10-CM | POA: Diagnosis not present

## 2019-06-04 DIAGNOSIS — M79641 Pain in right hand: Secondary | ICD-10-CM | POA: Diagnosis not present

## 2019-06-04 DIAGNOSIS — F419 Anxiety disorder, unspecified: Secondary | ICD-10-CM | POA: Diagnosis not present

## 2019-06-04 DIAGNOSIS — E7800 Pure hypercholesterolemia, unspecified: Secondary | ICD-10-CM

## 2019-06-04 DIAGNOSIS — I10 Essential (primary) hypertension: Secondary | ICD-10-CM

## 2019-06-04 DIAGNOSIS — F321 Major depressive disorder, single episode, moderate: Secondary | ICD-10-CM

## 2019-06-04 DIAGNOSIS — M79642 Pain in left hand: Secondary | ICD-10-CM

## 2019-06-04 MED ORDER — ATENOLOL 50 MG PO TABS: 50.0000 mg | ORAL_TABLET | Freq: Every day | ORAL | 1 refills | Status: DC

## 2019-06-04 MED ORDER — CHLORTHALIDONE 25 MG PO TABS: 25.0000 mg | ORAL_TABLET | Freq: Every day | ORAL | 1 refills | Status: DC

## 2019-06-04 MED ORDER — ESCITALOPRAM OXALATE 5 MG PO TABS: 5.0000 mg | ORAL_TABLET | Freq: Every day | ORAL | 1 refills | Status: DC

## 2019-06-04 MED ORDER — TRAZODONE HCL 50 MG PO TABS: 25.0000 mg | ORAL_TABLET | Freq: Every evening | ORAL | 1 refills | Status: DC | PRN

## 2019-06-04 NOTE — Progress Notes (Signed)
Name: Tammy Garcia   MRN: [id number]    DOB: 1965/08/23   Date:06/04/2019       Progress Note  Subjective  Chief Complaint  Chief Complaint  Patient presents with  . Hypertension  . Insomnia    HPI  Bilateral Carpal Tunnel/Hand Pain: She notes LEFT thumb and wrist numbness/tingling ongoing for several months; she also notes that her RIGHT hand goes numb in certain positions, worse when sleeping.  She also notes decreased AROM of her fingers at certain times.  She would like to talk to hand specialty about possible surgery.   HTN: -does take medications as prescribed - current regimen includes Atenolol and chlorthalidone.  - taking medications as instructed, no medication side effects noted, no TIAs, no chest pain on exertion, no dyspnea on exertion, no swelling of ankles, no palpitations. No changes, doing well. - DASH diet discussed in detail.  Anxiety and Insomnia: Has never really had an issue with anxiety or insomnia except for the last several few months. She does not have issues falling asleep, but has been waking up after 3-4 hours and can't go back to sleep because she is feeling anxious and negative emotions - she has been trying trazodone at 50mg  and it does help her to fall asleep, but she is still not staying asleep. She has never gone to counseling. 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  Tried buspar, it made her more anxious.    HLD: No medication at this time; needs to work on diet and exercise.  No chest pain or shortness of breath.  The 10-year ASCVD risk score Mikey Bussing DC Brooke Bonito., et al., 2013) is: 4.5%   Values used to calculate the score:     Age: 53 years     Sex: Female     Is Non-Hispanic African American: Yes     Diabetic: No     Tobacco smoker: Yes     Systolic Blood Pressure: 123456 mmHg     Is BP treated: Yes     HDL Cholesterol: 61 mg/dL     Total  Cholesterol: 213 mg/dL  HM: Declines TDAP and Flu shot; was referred to GI - needs to call and schedule.  Patient Active Problem List   Diagnosis Date Noted  . Onychomycosis with ingrown toenail 12/14/2018  . Perimenopausal vasomotor symptoms 12/14/2018  . Hypertensive disorder 10/08/2018    Past Surgical History:  Procedure Laterality Date  . TUBAL LIGATION      Family History  Problem Relation Age of Onset  . Cirrhosis Father   . Hyperlipidemia Sister   . Diabetes Maternal Aunt   . Kidney disease Maternal Aunt   . Hypertension Maternal Aunt   . Breast cancer Neg Hx     Social History   Socioeconomic History  . Marital status: Significant Other    Spouse name: Not on file  . Number of children: 3  . Years of education: 47  . Highest education level: Not on file  Occupational History  . Not on file  Social Needs  . Financial resource strain: Not hard at all  . Food insecurity    Worry: Never true    Inability: Never true  . Transportation needs    Medical: No    Non-medical: No  Tobacco Use  . Smoking status: Current Some Day Smoker  . Smokeless tobacco: Never Used  Substance and Sexual Activity  . Alcohol use: Yes  . Drug use: Yes    Types: Marijuana  . Sexual activity: Yes  Lifestyle  . Physical activity    Days per week: 0 days    Minutes per session: 0 min  . Stress: Not at all  Relationships  . Social connections    Talks on phone: More than three times a week    Gets together: Once a week    Attends religious service: Never    Active member of club or organization: No    Attends meetings of clubs or organizations: Never    Relationship status: Living with partner  . Intimate partner violence    Fear of current or ex partner: No    Emotionally abused: No    Physically abused: No    Forced sexual activity: No  Other Topics Concern  . Not on file  Social History Narrative  . Not on file     Current Outpatient Medications:  .  atenolol  (TENORMIN) 50 MG tablet, Take 50 mg by mouth daily., Disp: , Rfl:  .  chlorthalidone (HYGROTON) 25 MG tablet, Take 1 tablet (25 mg total) by mouth daily., Disp: 30 tablet, Rfl: 0 .  traZODone (DESYREL) 50 MG tablet, Take 0.5-1 tablets (25-50 mg total) by mouth at bedtime as needed for sleep., Disp: 30 tablet, Rfl: 3 .  ciclopirox (PENLAC) 8 % solution, Apply topically at bedtime. Apply over nail and surrounding skin. Apply daily over previous coat. After seven (7) days, may remove with alcohol and continue cycle. (Patient not taking: Reported on 03/01/2019), Disp: 6.6 mL, Rfl: 0 .  methylPREDNISolone (MEDROL DOSEPAK) 4 MG TBPK tablet, Taper per package instructions. (Patient not taking: Reported on 06/04/2019), Disp: 21 tablet, Rfl: 0  No Known Allergies  I personally reviewed active problem list, medication list, allergies, health maintenance, notes from last encounter, lab results with the patient/caregiver today.   ROS  Ten systems reviewed and is negative except as mentioned in HPI  Objective  Vitals:   06/04/19 0911  BP: 118/72  Pulse: 69  Resp: 16  Temp: (!) 97.2 F (36.2 C)  TempSrc: Temporal  SpO2: 96%  Weight: 139 lb 9.6 oz (63.3 kg)  Height: 5\' 1"  (1.549 m)    Body mass index is 26.38 kg/m.  Physical Exam  Constitutional: Patient appears well-developed and well-nourished. No distress.  HENT: Head: Normocephalic and atraumatic. Eyes: Conjunctivae and EOM are normal. No scleral icterus.   Neck: Normal range of motion. Neck supple. No JVD present.  Cardiovascular: Normal rate, regular rhythm and normal heart sounds.  No murmur heard. No BLE edema. Pulmonary/Chest: Effort normal and breath sounds normal. No respiratory distress. Musculoskeletal: Normal range of motion, no joint effusions. No gross deformities Neurological: Pt is alert and oriented to person, place, and time. No cranial nerve deficit. Coordination, balance, strength, speech and gait are normal.  Skin:  Skin is warm and dry. No rash noted. No erythema.  Psychiatric: Patient has a normal mood and affect. behavior is normal. Judgment and thought content normal.  No results found for this or any previous visit (from the past 72 hour(s)).  PHQ2/9: Depression screen [facility] 2/9 06/04/2019 03/01/2019 12/14/2018  Decreased Interest 2 0 1  Down, Depressed, Hopeless 2 0 1  PHQ - 2 Score 4 0 2  Altered sleeping 3 0 3  Tired, decreased energy 2 0 2  Change in appetite 2 0 0  Feeling bad or failure  about yourself  2 0 0  Trouble concentrating 0 0 0  Moving slowly or fidgety/restless 2 0 0  Suicidal thoughts 0 0 0  PHQ-9 Score 15 0 7  Difficult doing work/chores Somewhat difficult Not difficult at all Not difficult at all   PHQ-2/9 Result is positive.  Denies SI/HI  Fall Risk: Fall Risk  06/04/2019 03/01/2019 12/14/2018  Falls in the past year? 0 0 0  Number falls in past yr: 0 0 0  Injury with Fall? 0 0 0  Follow up Falls evaluation completed Falls evaluation completed -   Assessment & Plan  1. Anxiety - escitalopram (LEXAPRO) 5 MG tablet; Take 1 tablet (5 mg total) by mouth daily.  Dispense: 90 tablet; Refill: 1 - traZODone (DESYREL) 50 MG tablet; Take 0.5-1 tablets (25-50 mg total) by mouth at bedtime as needed for sleep.  Dispense: 90 tablet; Refill: 1  2. Current moderate episode of major depressive disorder without prior episode (HCC) - escitalopram (LEXAPRO) 5 MG tablet; Take 1 tablet (5 mg total) by mouth daily.  Dispense: 90 tablet; Refill: 1  3. Insomnia, unspecified type - escitalopram (LEXAPRO) 5 MG tablet; Take 1 tablet (5 mg total) by mouth daily.  Dispense: 90 tablet; Refill: 1 - traZODone (DESYREL) 50 MG tablet; Take 0.5-1 tablets (25-50 mg total) by mouth at bedtime as needed for sleep.  Dispense: 90 tablet; Refill: 1  4. Bilateral hand pain - Ambulatory referral to Orthopedics  5. Essential hypertension - atenolol (TENORMIN) 50 MG tablet; Take 1 tablet (50 mg total) by mouth  daily.  Dispense: 90 tablet; Refill: 1 - chlorthalidone (HYGROTON) 25 MG tablet; Take 1 tablet (25 mg total) by mouth daily.  Dispense: 90 tablet; Refill: 1  6. Elevated LDL cholesterol level - No medication indicated at this time.

## 2019-06-04 NOTE — Patient Instructions (Addendum)
Two medications you are on, when combined, may, in rare cases, cause something called serotonin syndrome.   If any of the following symptoms occur, please stop your antidepressant medication right away and present to the emergency department: Increased temperature, flushed skin and increased sweating, tremor, muscle stiffness, very dry mouth, or uncrontrolled fidgeting (needing to cross/uncross your legs constantly, needing to walk in place constantly, etc.)   Call [facility] to schedule your colonoscopy at [phone].

## 2019-06-08 ENCOUNTER — Encounter: Payer: Self-pay | Admitting: Family Medicine

## 2019-06-08 DIAGNOSIS — R110 Nausea: Secondary | ICD-10-CM

## 2019-06-08 MED ORDER — ONDANSETRON HCL 4 MG PO TABS: 4.0000 mg | ORAL_TABLET | Freq: Three times a day (TID) | ORAL | 0 refills | Status: DC | PRN

## 2019-07-14 ENCOUNTER — Encounter: Payer: Self-pay | Admitting: Family Medicine

## 2019-07-14 ENCOUNTER — Other Ambulatory Visit: Payer: Self-pay

## 2019-07-14 ENCOUNTER — Ambulatory Visit (INDEPENDENT_AMBULATORY_CARE_PROVIDER_SITE_OTHER): Payer: BC Managed Care – PPO | Admitting: Family Medicine

## 2019-07-14 DIAGNOSIS — H00011 Hordeolum externum right upper eyelid: Secondary | ICD-10-CM | POA: Diagnosis not present

## 2019-07-14 NOTE — Progress Notes (Signed)
Name: Tammy Garcia   MRN: [id number]    DOB: 21-Aug-1965   Date:07/14/2019       Progress Note  Subjective  Chief Complaint  Chief Complaint  Patient presents with  . Eye Problem    swollen, sore    I connected with  Tammy Garcia  on 07/14/19 at  9:40 AM EST by a video enabled telemedicine application and verified that I am speaking with the correct person using two identifiers.  I discussed the limitations of evaluation and management by telemedicine and the availability of in person appointments. The patient expressed understanding and agreed to proceed. Staff also discussed with the patient that there may be a patient responsible charge related to this service. Patient Location: Home Provider Location: Home Office Additional Individuals present: None  HPI  Pt presents with concern for right upper eyelid swelling that started 07/03/2019, and has continued to worsen.  She notes the swelling has continue to increase, her pain is constant and stinging, reports that it is causing her to have a headache.  She has been applying hot compresses over the last day or so, has brought the area to a head.  She thinks the area looks worse and larger today.  She has history of having to have multiple hordeolum lanced in the past.  No fevers/chills, no swelling aside from on the lid.  Some blurred vision in the right eye.   Patient Active Problem List   Diagnosis Date Noted  . Onychomycosis with ingrown toenail 12/14/2018  . Perimenopausal vasomotor symptoms 12/14/2018  . Hypertensive disorder 10/08/2018    Social History   Tobacco Use  . Smoking status: Current Some Day Smoker  . Smokeless tobacco: Never Used  Substance Use Topics  . Alcohol use: Yes     Current Outpatient Medications:  .  atenolol (TENORMIN) 50 MG tablet, Take 1 tablet (50 mg total) by mouth daily., Disp: 90 tablet, Rfl: 1 .  chlorthalidone (HYGROTON) 25 MG tablet, Take 1 tablet (25 mg total) by mouth daily., Disp:  90 tablet, Rfl: 1 .  escitalopram (LEXAPRO) 5 MG tablet, Take 1 tablet (5 mg total) by mouth daily., Disp: 90 tablet, Rfl: 1 .  ondansetron (ZOFRAN) 4 MG tablet, Take 1 tablet (4 mg total) by mouth every 8 (eight) hours as needed for nausea or vomiting., Disp: 20 tablet, Rfl: 0 .  traZODone (DESYREL) 50 MG tablet, Take 0.5-1 tablets (25-50 mg total) by mouth at bedtime as needed for sleep., Disp: 90 tablet, Rfl: 1  No Known Allergies  I personally reviewed active problem list, medication list, allergies, notes from last encounter, lab results with the patient/caregiver today.  ROS  Ten systems reviewed and is negative except as mentioned in HPI.  Objective  Virtual encounter, vitals not obtained.  There is no height or weight on file to calculate BMI.  Nursing Note and Vital Signs reviewed.  Physical Exam  Constitutional: Patient appears well-developed and well-nourished. No distress.  HENT: Head: Normocephalic and atraumatic. There is a large hordeolum to the central/lateral right upper lid that is erythematous and has a white center. Neck: Normal range of motion. Pulmonary/Chest: Effort normal. No respiratory distress. Speaking in complete sentences Neurological: Pt is alert and oriented to person, place, and time. Coordination, speech and gait are normal.  Psychiatric: Patient has a normal mood and affect. behavior is normal. Judgment and thought content normal.  No results found for this or any previous visit (from the past 72 hour(s)).  Assessment &  Plan  1. Hordeolum externum of right upper eyelid - Not improving over the course of about 10 days, has history of requiring I&D by ophthalmology in the past.  Apply hot compresses and taking ibuprofen with food or tylenol today while awaiting urgent referral. - Ambulatory referral to Ophthalmology   -Red flags and when to present for emergency care or RTC including fever >101.9F, chest pain, shortness of breath,  new/worsening/un-resolving symptoms, reviewed with patient at time of visit. Follow up and care instructions discussed and provided in AVS. - I discussed the assessment and treatment plan with the patient. The patient was provided an opportunity to ask questions and all were answered. The patient agreed with the plan and demonstrated an understanding of the instructions.  I provided 12 minutes of non-face-to-face time during this encounter.  Hubbard Hartshorn, FNP

## 2019-07-15 DIAGNOSIS — H0011 Chalazion right upper eyelid: Secondary | ICD-10-CM | POA: Diagnosis not present

## 2019-08-17 ENCOUNTER — Ambulatory Visit (INDEPENDENT_AMBULATORY_CARE_PROVIDER_SITE_OTHER): Payer: BC Managed Care – PPO | Admitting: Family Medicine

## 2019-08-17 ENCOUNTER — Encounter: Payer: Self-pay | Admitting: Family Medicine

## 2019-08-17 ENCOUNTER — Other Ambulatory Visit: Payer: Self-pay

## 2019-08-17 ENCOUNTER — Other Ambulatory Visit (HOSPITAL_COMMUNITY)
Admission: RE | Admit: 2019-08-17 | Discharge: 2019-08-17 | Disposition: A | Discharge status: Home / Self Care | Payer: BC Managed Care – PPO | Source: Ambulatory Visit | Attending: Family Medicine | Admitting: Family Medicine

## 2019-08-17 VITALS — BP 124/82 | HR 73 | Temp 97.1°F | Resp 16 | Ht 61.0 in | Wt 141.1 lb

## 2019-08-17 DIAGNOSIS — Z01419 Encounter for gynecological examination (general) (routine) without abnormal findings: Secondary | ICD-10-CM | POA: Diagnosis not present

## 2019-08-17 DIAGNOSIS — Z113 Encounter for screening for infections with a predominantly sexual mode of transmission: Secondary | ICD-10-CM | POA: Diagnosis not present

## 2019-08-17 DIAGNOSIS — F419 Anxiety disorder, unspecified: Secondary | ICD-10-CM

## 2019-08-17 DIAGNOSIS — N951 Menopausal and female climacteric states: Secondary | ICD-10-CM

## 2019-08-17 DIAGNOSIS — Z1212 Encounter for screening for malignant neoplasm of rectum: Secondary | ICD-10-CM

## 2019-08-17 DIAGNOSIS — Z13220 Encounter for screening for lipoid disorders: Secondary | ICD-10-CM

## 2019-08-17 DIAGNOSIS — I10 Essential (primary) hypertension: Secondary | ICD-10-CM | POA: Diagnosis not present

## 2019-08-17 DIAGNOSIS — G4700 Insomnia, unspecified: Secondary | ICD-10-CM | POA: Diagnosis not present

## 2019-08-17 DIAGNOSIS — Z1211 Encounter for screening for malignant neoplasm of colon: Secondary | ICD-10-CM

## 2019-08-17 DIAGNOSIS — Z124 Encounter for screening for malignant neoplasm of cervix: Secondary | ICD-10-CM | POA: Diagnosis not present

## 2019-08-17 MED ORDER — ESCITALOPRAM OXALATE 10 MG PO TABS: 10.0000 mg | ORAL_TABLET | Freq: Every day | ORAL | 1 refills | Status: DC

## 2019-08-17 MED ORDER — TRAZODONE HCL 50 MG PO TABS: 25.0000 mg | ORAL_TABLET | Freq: Every evening | ORAL | 1 refills | Status: DC | PRN

## 2019-08-17 MED ORDER — HYDROXYZINE HCL 10 MG PO TABS: 10.0000 mg | ORAL_TABLET | Freq: Three times a day (TID) | ORAL | 2 refills | Status: DC | PRN

## 2019-08-17 NOTE — Patient Instructions (Signed)

## 2019-08-17 NOTE — Progress Notes (Signed)
Name: Tammy Garcia   MRN: [id number]    DOB: 1965-08-21   Date:08/17/2019       Progress Note  Subjective  Chief Complaint  Chief Complaint  Patient presents with  . Annual Exam  . Follow-up    HPI  Patient presents for annual CPE and follow up on anxiety and insomnia:  Anxiety and Insomnia:  She started Lexapro 33m about 2 months ago and feels like she could use a higher dose - anxiety is still not well controlled, though slightly improved.  She also notes breakthrough anxiety in the middle of the night and sometimes during the day - will add hydroxyzine PRN. - Has never really had an issue with anxiety or insomniaexceptfor the last severalfew months - notes LMP was September 2020 and thinks she is going through menopause at this time. Shedoes not haveissues falling asleep, but has been waking up after 3-4 hours and can't go back to sleep because she is feeling anxious and negative emotions - she has been trying trazodone at 530mand it does help her to fall asleep, but not stay asleep.She has never gone to counseling. 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    Diet: Eating out a lot; she has been noticing increase in appetite since starting Lexapro and is happy with this.  Encouraged healthy food options, less fried foods, more vegetables.  Does not drink water - drinks pepsi. Exercise: Not exercising at this time- education is provided.   USPSTF grade A and B recommendations    [facility]  [facility]    Drinking a 25oz beer daily.    Depression: Phq 9 is  [facility] 08/17/2019 07/14/2019 06/04/2019 03/01/2019 12/14/2018  Decreased Interest 0 0 2 0 1  Down, Depressed, Hopeless 0 0 2 0 1  PHQ - 2 Score 0 0 4 0 2  Altered sleeping 0 0 3 0 3  Tired, decreased energy 0  0 2 0 2  Change in appetite 0 0 2 0 0  Feeling bad or failure about yourself  0 0 2 0 0  Trouble concentrating 0 0 0 0 0  Moving slowly or fidgety/restless 0 0 2 0 0  Suicidal thoughts 0 0 0 0 0  PHQ-9 Score 0 0 15 0 7  Difficult doing work/chores Not difficult at all Not difficult at all Somewhat difficult Not difficult at all Not difficult at all   Hypertension: BP Readings from Last 3 Encounters:  06/04/19 118/72  02/23/19 (!) 137/92  09/01/18 139/88   Obesity: Wt Readings from Last 3 Encounters:  08/17/19 141 lb 1.6 oz (64 kg)  06/04/19 139 lb 9.6 oz (63.3 kg)  02/23/19 145 lb (65.8 kg)   BMI Readings from Last 3 Encounters:  08/17/19 26.66 kg/m  06/04/19 26.38 kg/m  02/23/19 26.52 kg/m     Hep C Screening: Hep C negative in June 2020 STD testing and prevention (HIV/chl/gon/syphilis): No new sexual partners in the last year. Would like STI screening today Marital Status: Significant Other Intimate partner violence: No concerns Sexual History (Partners/Practices/Protection from STI/Past hx STI/Pregnancy Plans): Female partner, not using condoms - monogamous relationship, no pain or post-coital bleeding. Menstrual History/LMP/Abnormal Bleeding:  LMP September 2020 - going through hot flashes and some anxiety at this time.  Discussed menopause in detail.  If hot flashes become unmanageable, we will refer to GYN for consideration of HRT. Incontinence Symptoms: No concerns  Breast cancer:  - Last Mammogram: Overdue - last was in 2018; ordered in May 2020 but never went, reminded her to schedule ASAP. - BRCA gene screening: No family history  Osteoporosis: Discussed high calcium and vitamin D supplementation, weight bearing exercises.  No family history; no personal history of low vitamin D  Cervical cancer screening: Due today  Skin cancer: Discussed monitoring for atypical lesions.  No concerning lesions noted today. Colorectal cancer: Denies family or personal history of  colorectal cancer, no changes in BM's - no blood in stool, dark and tarry stool, mucus in stool, or constipation/diarrhea.  She would like to do cologuard - will order today. Lung cancer:  Smoking about 2-3 cigarettes a day.  Low Dose CT Chest recommended if Age 48-80 years, 30 pack-year currently smoking OR have quit w/in 15years. Patient does not qualify.  Cessation discussed in detail. ECG:  Denies chest pain, shortness of breath, no palpitations  Advanced Care Planning: A voluntary discussion about advance care planning including the explanation and discussion of advance directives.  Discussed health care proxy and Living will, and the patient was able to identify a health care proxy as her three Daughters.  Patient does not have a living will at present time. If patient does have living will, I have requested they bring this to the clinic to be scanned in to their chart.  Lipids: Lab Results  Component Value Date   CHOL 213 (H) 01/11/2019   Lab Results  Component Value Date   HDL 61 01/11/2019   Lab Results  Component Value Date   LDLCALC 133 (H) 01/11/2019   Lab Results  Component Value Date   TRIG 89 01/11/2019   Lab Results  Component Value Date   CHOLHDL 3.5 01/11/2019   No results found for: LDLDIRECT  Glucose: Glucose  Date Value Ref Range Status  03/16/2014 135 (H) 65 - 99 mg/dL Final   Glucose, Bld  Date Value Ref Range Status  01/11/2019 82 65 - 99 mg/dL Final    Comment:    .            Fasting reference interval .     Patient Active Problem List   Diagnosis Date Noted  . Onychomycosis with ingrown toenail 12/14/2018  . Perimenopausal vasomotor symptoms 12/14/2018  . Hypertensive disorder 10/08/2018    Past Surgical History:  Procedure Laterality Date  . TUBAL LIGATION      Family History  Problem Relation Age of Onset  . Cirrhosis Father   . Hyperlipidemia Sister   . Diabetes Maternal Aunt   . Kidney disease Maternal Aunt   . Hypertension  Maternal Aunt   . Breast cancer Neg Hx     Social History   Socioeconomic History  . Marital status: Significant Other    Spouse name: Not on file  . Number of children: 3  . Years of education: 73  . Highest education level: Not on file  Occupational History  . Not on file  Tobacco Use  . Smoking status: Current Some Day Smoker  . Smokeless tobacco: Never Used  Substance and Sexual Activity  . Alcohol use: Yes  . Drug use: Yes    Types: Marijuana  . Sexual activity: Yes  Other Topics Concern  . Not on file  Social History Narrative  . Not on file  Social Determinants of Health   Financial Resource Strain: Low Risk   . Difficulty of Paying Living Expenses: Not hard at all  Food Insecurity: No Food Insecurity  . Worried About Charity fundraiser in the Last Year: Never true  . Ran Out of Food in the Last Year: Never true  Transportation Needs: No Transportation Needs  . Lack of Transportation (Medical): No  . Lack of Transportation (Non-Medical): No  Physical Activity: Inactive  . Days of Exercise per Week: 0 days  . Minutes of Exercise per Session: 0 min  Stress: No Stress Concern Present  . Feeling of Stress : Not at all  Social Connections: Somewhat Isolated  . Frequency of Communication with Friends and Family: More than three times a week  . Frequency of Social Gatherings with Friends and Family: Once a week  . Attends Religious Services: Never  . Active Member of Clubs or Organizations: No  . Attends Archivist Meetings: Never  . Marital Status: Living with partner  Intimate Partner Violence: Not At Risk  . Fear of Current or Ex-Partner: No  . Emotionally Abused: No  . Physically Abused: No  . Sexually Abused: No     Current Outpatient Medications:  .  atenolol (TENORMIN) 50 MG tablet, Take 1 tablet (50 mg total) by mouth daily., Disp: 90 tablet, Rfl: 1 .  chlorthalidone (HYGROTON) 25 MG tablet, Take 1 tablet (25 mg total) by mouth daily.,  Disp: 90 tablet, Rfl: 1 .  escitalopram (LEXAPRO) 5 MG tablet, Take 1 tablet (5 mg total) by mouth daily., Disp: 90 tablet, Rfl: 1 .  ondansetron (ZOFRAN) 4 MG tablet, Take 1 tablet (4 mg total) by mouth every 8 (eight) hours as needed for nausea or vomiting. (Patient not taking: Reported on 08/17/2019), Disp: 20 tablet, Rfl: 0 .  traZODone (DESYREL) 50 MG tablet, Take 0.5-1 tablets (25-50 mg total) by mouth at bedtime as needed for sleep. (Patient not taking: Reported on 08/17/2019), Disp: 90 tablet, Rfl: 1  No Known Allergies   ROS  Constitutional: Negative for fever or weight change.  Respiratory: Negative for cough and shortness of breath.   Cardiovascular: Negative for chest pain or palpitations.  Gastrointestinal: Negative for abdominal pain, no bowel changes.  Musculoskeletal: Negative for gait problem or joint swelling.  Skin: Negative for rash.  Neurological: Negative for dizziness or headache.  No other specific complaints in a complete review of systems (except as listed in HPI above).  Objective  Vitals:   08/17/19 0816  Pulse: 73  Resp: 16  Temp: (!) 97.1 F (36.2 C)  TempSrc: Temporal  SpO2: 99%  Weight: 141 lb 1.6 oz (64 kg)  Height: _0  (1.549 m)    Body mass index is 26.66 kg/m.  Physical Exam  Constitutional: Patient appears well-developed and well-nourished. No distress.  HENT: Head: Normocephalic and atraumatic. Ears: B TMs ok, no erythema or effusion; Nose: Nose normal. Mouth/Throat: Oropharynx is clear and moist. No oropharyngeal exudate.  Eyes: Conjunctivae and EOM are normal. Pupils are equal, round, and reactive to light. No scleral icterus.  Neck: Normal range of motion. Neck supple. No JVD present. No thyromegaly present.  Cardiovascular: Normal rate, regular rhythm and normal heart sounds.  No murmur heard. No BLE edema. Pulmonary/Chest: Effort normal and breath sounds normal. No respiratory distress. Abdominal: Soft. Bowel sounds are normal, no  distension. There is no tenderness. no masses Breast: no lumps or masses, no nipple discharge or rashes FEMALE GENITALIA:  External genitalia normal External urethra normal Vaginal vault normal without discharge or lesions Cervix normal without discharge or lesions Bimanual exam normal without masses RECTAL: no rectal masses or hemorrhoids Musculoskeletal: Normal range of motion, no joint effusions. No gross deformities Neurological: he is alert and oriented to person, place, and time. No cranial nerve deficit. Coordination, balance, strength, speech and gait are normal.  Skin: Skin is warm and dry. No rash noted. No erythema.  Psychiatric: Patient has a normal mood and affect. behavior is normal. Judgment and thought content normal.  No results found for this or any previous visit (from the past 2160 hour(s)).   Fall Risk: Fall Risk  08/17/2019 07/14/2019 06/04/2019 03/01/2019 12/14/2018  Falls in the past year? 0 0 0 0 0  Number falls in past yr: 0 0 0 0 0  Injury with Fall? 0 0 0 0 0  Follow up Falls evaluation completed Falls evaluation completed Falls evaluation completed Falls evaluation completed -   Assessment & Plan  1. Well woman exam with routine gynecological exam -USPSTF grade A and B recommendations reviewed with patient; age-appropriate recommendations, preventive care, screening tests, etc discussed and encouraged; healthy living encouraged; see AVS for patient education given to patient -Discussed importance of 150 minutes of physical activity weekly, eat two servings of fish weekly, eat one serving of tree nuts ( cashews, pistachios, pecans, almonds.Marland Kitchen) every other day, eat 6 servings of fruit/vegetables daily and drink plenty of water and avoid sweet beverages.   2. Insomnia, unspecified type - escitalopram (LEXAPRO) 10 MG tablet; Take 1 tablet (10 mg total) by mouth daily.  Dispense: 90 tablet; Refill: 1 - traZODone (DESYREL) 50 MG tablet; Take 0.5-1 tablets (25-50 mg  total) by mouth at bedtime as needed for sleep.  Dispense: 90 tablet; Refill: 1  3. Anxiety - hydrOXYzine (ATARAX/VISTARIL) 10 MG tablet; Take 1 tablet (10 mg total) by mouth 3 (three) times daily as needed for anxiety.  Dispense: 30 tablet; Refill: 2 - escitalopram (LEXAPRO) 10 MG tablet; Take 1 tablet (10 mg total) by mouth daily.  Dispense: 90 tablet; Refill: 1 - traZODone (DESYREL) 50 MG tablet; Take 0.5-1 tablets (25-50 mg total) by mouth at bedtime as needed for sleep.  Dispense: 90 tablet; Refill: 1  4. Perimenopausal - escitalopram (LEXAPRO) 10 MG tablet; Take 1 tablet (10 mg total) by mouth daily.  Dispense: 90 tablet; Refill: 1 - Will refer to GYN if hot flashes and/or mood is not well controlled with medication changes as above to consider HRT or other intervention.   5. Cervical cancer screening - Cytology - PAP  6. Routine screening for STI (sexually transmitted infection) - Cytology - PAP - HIV Antibody (routine testing w rflx) - RPR  7. Essential hypertension - COMPLETE METABOLIC PANEL WITH GFR  8. Lipid screening - Lipid panel  9. Encounter for colorectal cancer screening - Cologuard

## 2019-08-18 LAB — COMPLETE METABOLIC PANEL WITH GFR
AG Ratio: 1.6 (calc) (ref 1.0–2.5)
ALT: 17 U/L (ref 6–29)
AST: 18 U/L (ref 10–35)
Albumin: 4.4 g/dL (ref 3.6–5.1)
Alkaline phosphatase (APISO): 68 U/L (ref 37–153)
BUN: 15 mg/dL (ref 7–25)
CO2: 32 mmol/L (ref 20–32)
Calcium: 10.3 mg/dL (ref 8.6–10.4)
Chloride: 102 mmol/L (ref 98–110)
Creat: 0.73 mg/dL (ref 0.50–1.05)
GFR, Est African American: 111 mL/min/{1.73_m2} (ref >=60)
GFR, Est Non African American: 95 mL/min/{1.73_m2} (ref >=60)
Globulin: 2.7 g/dL (calc) (ref 1.9–3.7)
Glucose, Bld: 74 mg/dL (ref 65–99)
Potassium: 4.4 mmol/L (ref 3.5–5.3)
Sodium: 138 mmol/L (ref 135–146)
Total Bilirubin: 0.4 mg/dL (ref 0.2–1.2)
Total Protein: 7.1 g/dL (ref 6.1–8.1)

## 2019-08-18 LAB — RPR: RPR Ser Ql: NONREACTIVE

## 2019-08-18 LAB — LIPID PANEL
Cholesterol: 227 mg/dL — ABNORMAL HIGH (ref <200)
HDL: 71 mg/dL (ref >=50)
LDL Cholesterol (Calc): 137 mg/dL (calc) — ABNORMAL HIGH
Non-HDL Cholesterol (Calc): 156 mg/dL (calc) — ABNORMAL HIGH (ref <130)
Total CHOL/HDL Ratio: 3.2 (calc) (ref <5.0)
Triglycerides: 93 mg/dL (ref <150)

## 2019-08-18 LAB — HIV ANTIBODY (ROUTINE TESTING W REFLEX): HIV 1&2 Ab, 4th Generation: NONREACTIVE

## 2019-08-19 LAB — CYTOLOGY - PAP
Chlamydia: NEGATIVE
Comment: NEGATIVE
Comment: NEGATIVE
Comment: NORMAL
Diagnosis: NEGATIVE
High risk HPV: NEGATIVE
Neisseria Gonorrhea: NEGATIVE

## 2019-08-20 ENCOUNTER — Encounter: Payer: Self-pay | Admitting: Family Medicine

## 2019-08-20 DIAGNOSIS — A599 Trichomoniasis, unspecified: Secondary | ICD-10-CM

## 2019-08-20 MED ORDER — METRONIDAZOLE 500 MG PO TABS: 500.0000 mg | ORAL_TABLET | Freq: Two times a day (BID) | ORAL | 0 refills | Status: AC

## 2019-09-02 DIAGNOSIS — U071 COVID-19: Secondary | ICD-10-CM | POA: Diagnosis not present

## 2019-09-06 ENCOUNTER — Encounter: Payer: Self-pay | Admitting: Family Medicine

## 2019-09-06 ENCOUNTER — Ambulatory Visit (INDEPENDENT_AMBULATORY_CARE_PROVIDER_SITE_OTHER): Payer: BC Managed Care – PPO | Admitting: Family Medicine

## 2019-09-06 VITALS — Ht 61.0 in | Wt 141.0 lb

## 2019-09-06 DIAGNOSIS — H00011 Hordeolum externum right upper eyelid: Secondary | ICD-10-CM | POA: Diagnosis not present

## 2019-09-06 MED ORDER — ERYTHROMYCIN 5 MG/GM OP OINT: TOPICAL_OINTMENT | OPHTHALMIC | 0 refills | Status: DC

## 2019-09-06 NOTE — Progress Notes (Signed)
Name: Tammy Garcia   MRN: [id number]    DOB: 1966/04/13   Date:09/06/2019       Progress Note  Subjective:    Chief Complaint  Chief Complaint  Patient presents with  . Stye    recurrent right eye, onset 3 days.  Pt states even bigger than before    I connected with  [name]  on 09/06/19 at  1:20 PM EST by a video enabled telemedicine application and verified that I am speaking with the correct person using two identifiers.  I discussed the limitations of evaluation and management by telemedicine and the availability of in person appointments. The patient expressed understanding and agreed to proceed. Staff also discussed with the patient that there may be a patient responsible charge related to this service. Patient Location: home Provider Location: [facility] Additional Individuals present: none  HPI Presents for recurrent stye to right eye, onset 3 days ago, worse than past occurences.   Very round nodule to mid right upper eyelid, started sat, trying warm compresses, no improvement.  She has not conjunctivitis, eye pain, blurred vision, purulent eye drainage.  Only one discrete area on her right upper eyelid is swollen like a small pea that appears consistent with a pustule but she has not had any drainage.  She denies any progressive swelling or redness of her right upper eyelid or around her eye.  She is not wearing any contacts.  But does have prescription eyewear on. Most recent visits for same were 07/14/2019 and she did have to f/up with [facility] to have it lanced.   Patient Active Problem List   Diagnosis Date Noted  . Onychomycosis with ingrown toenail 12/14/2018  . Perimenopausal vasomotor symptoms 12/14/2018  . Hypertensive disorder 10/08/2018    Social History   Tobacco Use  . Smoking status: Current Some Day Smoker  . Smokeless tobacco: Never Used  Substance Use Topics  . Alcohol use: Yes     Current Outpatient Medications:  .  atenolol  (TENORMIN) 50 MG tablet, Take 1 tablet (50 mg total) by mouth daily., Disp: 90 tablet, Rfl: 1 .  chlorthalidone (HYGROTON) 25 MG tablet, Take 1 tablet (25 mg total) by mouth daily., Disp: 90 tablet, Rfl: 1 .  escitalopram (LEXAPRO) 10 MG tablet, Take 1 tablet (10 mg total) by mouth daily., Disp: 90 tablet, Rfl: 1 .  hydrOXYzine (ATARAX/VISTARIL) 10 MG tablet, Take 1 tablet (10 mg total) by mouth 3 (three) times daily as needed for anxiety., Disp: 30 tablet, Rfl: 2 .  ondansetron (ZOFRAN) 4 MG tablet, Take 1 tablet (4 mg total) by mouth every 8 (eight) hours as needed for nausea or vomiting., Disp: 20 tablet, Rfl: 0 .  traZODone (DESYREL) 50 MG tablet, Take 0.5-1 tablets (25-50 mg total) by mouth at bedtime as needed for sleep., Disp: 90 tablet, Rfl: 1  No Known Allergies  I personally reviewed active problem list, medication list, allergies, family history, social history, health maintenance, notes from last encounter, lab results, imaging with the patient/caregiver today.  Review of Systems  Constitutional: Negative.  Negative for activity change, appetite change, chills, diaphoresis, fatigue, fever and unexpected weight change.  HENT: Negative.   Eyes: Negative.  Negative for photophobia, pain, discharge, redness, itching and visual disturbance.  Respiratory: Negative.   Cardiovascular: Negative.   Gastrointestinal: Negative.   Endocrine: Negative.   Genitourinary: Negative.   Musculoskeletal: Negative.   Skin: Negative.   Allergic/Immunologic: Negative.   Neurological: Negative.   Hematological:  Negative.   Psychiatric/Behavioral: Negative.   All other systems reviewed and are negative.    Objective:   Virtual encounter, vitals limited, only able to obtain the following Today's Vitals   09/06/19 1036  Weight: 141 lb (64 kg)  Height: 5\' 1"  (1.549 m)  PainSc: 10-Worst pain ever   Body mass index is 26.64 kg/m. Nursing Note and Vital Signs reviewed.  Physical Exam Vitals  and nursing note reviewed.  Constitutional:      General: She is not in acute distress.    Appearance: Normal appearance. She is well-developed. She is not ill-appearing, toxic-appearing or diaphoretic.  HENT:     Head: Normocephalic and atraumatic.  Eyes:     General:        Right eye: No discharge.        Left eye: No discharge.     Conjunctiva/sclera: Conjunctivae normal.     Comments: Right upper mid eyelid with appearance of pustule nodule appears very raised, round, and slightly less than 1 cm in diameter, no adjacent erythema or edema visualized Good eye movement bilaterally No conjunctival injection or discharge visualized  Patient wearing glasses and removes them for the exam  Neck:     Trachea: No tracheal deviation.  Cardiovascular:     Rate and Rhythm: Normal rate.  Pulmonary:     Effort: Pulmonary effort is normal. No respiratory distress.     Breath sounds: No stridor.  Skin:    Coloration: Skin is not jaundiced or pale.     Findings: No rash.  Neurological:     Mental Status: She is alert.  Psychiatric:        Mood and Affect: Mood normal.        Behavior: Behavior normal.     PE limited by telephone encounter  No results found for this or any previous visit (from the past 72 hour(s)).  Assessment and Plan:     ICD-10-CM   1. Hordeolum externum of right upper eyelid  H00.011    Erythromycin ointment plus warm soaks and gentle exfoliation of her eyelash line, it may drain on its own, patient will follow up at [facility]  recurrent hordeolum?  Advised patient to discard all of her eye make-up or beauty products that are specific towards her eyes and to replace with new supplies after antibiotics are complete. Treating with erythromycin ointment twice a day for possible blepharitis? Encourage patient to use erythromycin ointment continue warm soaks and do very gentle exfoliation along her eyelash line to encourage spontaneous drainage.  She has previously  seen [facility] only last month encouraged her to call and follow-up with them as needed.  She does request that I put in a referral as well today because of the new year with her insurance - she wants to make sure she can get in with them  Work note given per her request  -Red flags and when to present for emergency care or RTC including fever >101.76F, chest pain, shortness of breath, new/worsening/un-resolving symptoms,  reviewed with patient at time of visit. Follow up and care instructions discussed and provided in AVS. - I discussed the assessment and treatment plan with the patient. The patient was provided an opportunity to ask questions and all were answered. The patient agreed with the plan and demonstrated an understanding of the instructions.  I provided 11 minutes of non-face-to-face time during this encounter.  Delsa Grana, PA-C 09/06/19 1:30 PM

## 2019-09-21 DIAGNOSIS — U071 COVID-19: Secondary | ICD-10-CM | POA: Diagnosis not present

## 2019-09-29 DIAGNOSIS — U071 COVID-19: Secondary | ICD-10-CM | POA: Diagnosis not present

## 2019-10-05 DIAGNOSIS — U071 COVID-19: Secondary | ICD-10-CM | POA: Diagnosis not present

## 2019-10-19 ENCOUNTER — Other Ambulatory Visit: Payer: Self-pay | Admitting: Emergency Medicine

## 2019-11-15 ENCOUNTER — Ambulatory Visit: Payer: BC Managed Care – PPO | Admitting: Family Medicine

## 2019-11-30 ENCOUNTER — Encounter: Payer: Self-pay | Admitting: Family Medicine

## 2019-11-30 ENCOUNTER — Ambulatory Visit (INDEPENDENT_AMBULATORY_CARE_PROVIDER_SITE_OTHER): Payer: 59 | Admitting: Family Medicine

## 2019-11-30 VITALS — Ht 62.0 in | Wt 142.0 lb

## 2019-11-30 DIAGNOSIS — Z720 Tobacco use: Secondary | ICD-10-CM | POA: Insufficient documentation

## 2019-11-30 DIAGNOSIS — N390 Urinary tract infection, site not specified: Secondary | ICD-10-CM | POA: Diagnosis not present

## 2019-11-30 DIAGNOSIS — E7800 Pure hypercholesterolemia, unspecified: Secondary | ICD-10-CM | POA: Insufficient documentation

## 2019-11-30 DIAGNOSIS — F419 Anxiety disorder, unspecified: Secondary | ICD-10-CM | POA: Insufficient documentation

## 2019-11-30 DIAGNOSIS — G4700 Insomnia, unspecified: Secondary | ICD-10-CM | POA: Insufficient documentation

## 2019-11-30 MED ORDER — NITROFURANTOIN MONOHYD MACRO 100 MG PO CAPS: 100.0000 mg | ORAL_CAPSULE | Freq: Two times a day (BID) | ORAL | 0 refills | Status: DC

## 2019-11-30 NOTE — Progress Notes (Signed)
Name: Tammy Garcia   MRN: [id number]    DOB: Nov 27, 1965   Date:11/30/2019       Progress Note  Subjective:    Chief Complaint  Chief Complaint  Patient presents with  . Urinary Tract Infection    Pt told will need to come in to give urine sample, low back pain, nausea, urine odor    I connected with  [name]  on 11/30/19 at  9:20 AM EDT by a video enabled telemedicine application and verified that I am speaking with the correct person using two identifiers.  I discussed the limitations of evaluation and management by telemedicine and the availability of in person appointments. The patient expressed understanding and agreed to proceed. Staff also discussed with the patient that there may be a patient responsible charge related to this service. Patient Location: home Provider Location: [facility] Additional Individuals present: none  Low back pain and lower side pain that started Sunday about 2 days ago, nauseous with loss of appetite, smell of food made her sick.   Pt points to bilateral lower back, pain wrapping around her hips to lower abd/pelvis, pain described as achy, constant with associated urinary freq and odor.  No hematuria, dysuria.  No vaginal sx. Denies abd cramping, vomiting, diarrhea.  No sick contacts. Works in health care, 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 Episode onset: 2 days ago. The problem occurs intermittently. The problem has been unchanged. The quality of the pain is described as aching. The pain is moderate. There has been no fever. There is no history of pyelonephritis. Associated symptoms include flank pain, frequency, nausea and urgency. Pertinent negatives include no chills, discharge, hematuria, hesitancy, possible pregnancy, sweats or vomiting. Associated symptoms comments: Urine had odor. She has tried nothing for the symptoms. There is no history of recurrent UTIs.   No hx of UTI's  No vaginal  sx.  Cold chills yesterday, no fever    Patient Active Problem List   Diagnosis Date Noted  . Onychomycosis with ingrown toenail 12/14/2018  . Perimenopausal vasomotor symptoms 12/14/2018  . Hypertensive disorder 10/08/2018    Social History   Tobacco Use  . Smoking status: Current Some Day Smoker  . Smokeless tobacco: Never Used  Substance Use Topics  . Alcohol use: Yes     Current Outpatient Medications:  .  atenolol (TENORMIN) 50 MG tablet, Take 1 tablet (50 mg total) by mouth daily., Disp: 90 tablet, Rfl: 1 .  chlorthalidone (HYGROTON) 25 MG tablet, Take 1 tablet (25 mg total) by mouth daily., Disp: 90 tablet, Rfl: 1 .  escitalopram (LEXAPRO) 10 MG tablet, Take 1 tablet (10 mg total) by mouth daily., Disp: 90 tablet, Rfl: 1 .  traZODone (DESYREL) 50 MG tablet, Take 0.5-1 tablets (25-50 mg total) by mouth at bedtime as needed for sleep., Disp: 90 tablet, Rfl: 1  No Known Allergies  I personally reviewed active problem list, medication list, allergies, family history, social history, health maintenance, notes from last encounter, lab results, imaging with the patient/caregiver today.   Review of Systems  Constitutional: Negative.  Negative for chills.  HENT: Negative.   Eyes: Negative.   Respiratory: Negative.   Cardiovascular: Negative.   Gastrointestinal: Positive for nausea. Negative for vomiting.  Endocrine: Negative.   Genitourinary: Positive for flank pain, frequency and urgency. Negative for hematuria and hesitancy.  Skin: Negative.   Allergic/Immunologic: Negative.   Neurological: Negative.   Hematological: Negative.  Psychiatric/Behavioral: Negative.   All other systems reviewed and are negative.    Objective:   Virtual encounter, vitals limited, only able to obtain the following Today's Vitals   11/30/19 0930 11/30/19 0932  Weight: 142 lb (64.4 kg)   Height: 5\' 2"  (1.575 m)   PainSc:  6    Body mass index is 25.97 kg/m. Nursing Note and  Vital Signs reviewed.  Physical Exam Vitals and nursing note reviewed.  Constitutional:      General: She is not in acute distress.    Appearance: Normal appearance. She is well-developed. She is not ill-appearing, toxic-appearing or diaphoretic.     Comments: Non-toxic female, alert  HENT:     Head: Normocephalic and atraumatic.  Neck:     Trachea: No tracheal deviation.  Pulmonary:     Effort: Pulmonary effort is normal. No respiratory distress.  Neurological:     Mental Status: She is alert.  Psychiatric:        Mood and Affect: Mood normal.     PE limited by telephone encounter  No results found for this or any previous visit (from the past 72 hour(s)).  Assessment and Plan:     ICD-10-CM   1. Urinary tract infection without hematuria, site unspecified  N39.0 nitrofurantoin, macrocrystal-monohydrate, (MACROBID) 100 MG capsule  Pt to come by and give urine sample for testing - will do POC urine dip and add urine culture since unable to do physical exam or get full vitals -with pain and nausea do feel that the urine culture is appropriate to ensure there is no resistance and would guide treatment if she has any worsening suspicious for pyelonephritis.  Discussed with her that her symptoms could be a GI illness although she is not had any changes in bowels and not had any vomiting at this time.  Encouraged her to let us know right away if any symptoms develop.  She also was hesitant to take pills wanted to know if I could send in in a liquid form so we discussed choosing a medicine that was easier for her to take and we will try Macrobid twice daily for 3 to 5 days and avoid Keflex with more frequent dosing at this time.  If she has nausea vomiting unable to take her medication she was encouraged to follow-up at urgent care or come to the office for a antibiotic shot or let us know so we can send in antiemetics  -Red flags and when to present for emergency care or RTC including  fever >101.67F, chest pain, shortness of breath, new/worsening/un-resolving symptoms, reviewed with patient at time of visit. Follow up and care instructions discussed and provided in AVS. - I discussed the assessment and treatment plan with the patient. The patient was provided an opportunity to ask questions and all were answered. The patient agreed with the plan and demonstrated an understanding of the instructions.  I provided 20+ minutes of non-face-to-face time during this encounter.  Delsa Grana, PA-C 11/30/19 9:40 AM

## 2019-12-01 LAB — URINALYSIS, ROUTINE W REFLEX MICROSCOPIC
Bilirubin Urine: NEGATIVE
Glucose, UA: NEGATIVE
Hyaline Cast: NONE SEEN /LPF
Ketones, ur: NEGATIVE
Nitrite: POSITIVE — AB
Protein, ur: NEGATIVE
Specific Gravity, Urine: 1.015 (ref 1.001–1.03)
Squamous Epithelial / HPF: NONE SEEN /HPF (ref <=5)
pH: <=5 (ref 5.0–8.0)

## 2019-12-01 LAB — URINE CULTURE
MICRO NUMBER:: 10385820
SPECIMEN QUALITY:: ADEQUATE

## 2019-12-10 ENCOUNTER — Ambulatory Visit: Payer: 59 | Admitting: Family Medicine

## 2019-12-30 ENCOUNTER — Ambulatory Visit: Payer: 59 | Admitting: Family Medicine

## 2020-01-20 ENCOUNTER — Encounter: Payer: Self-pay | Admitting: Family Medicine

## 2020-01-20 ENCOUNTER — Other Ambulatory Visit: Payer: Self-pay

## 2020-01-20 ENCOUNTER — Ambulatory Visit: Payer: 59 | Admitting: Family Medicine

## 2020-01-20 VITALS — BP 118/78 | HR 74 | Temp 98.3°F | Resp 14 | Ht 62.0 in | Wt 146.0 lb

## 2020-01-20 DIAGNOSIS — Z1231 Encounter for screening mammogram for malignant neoplasm of breast: Secondary | ICD-10-CM

## 2020-01-20 DIAGNOSIS — N951 Menopausal and female climacteric states: Secondary | ICD-10-CM | POA: Diagnosis not present

## 2020-01-20 DIAGNOSIS — F419 Anxiety disorder, unspecified: Secondary | ICD-10-CM | POA: Diagnosis not present

## 2020-01-20 DIAGNOSIS — Z1211 Encounter for screening for malignant neoplasm of colon: Secondary | ICD-10-CM

## 2020-01-20 DIAGNOSIS — G4700 Insomnia, unspecified: Secondary | ICD-10-CM

## 2020-01-20 DIAGNOSIS — I10 Essential (primary) hypertension: Secondary | ICD-10-CM

## 2020-01-20 MED ORDER — ATENOLOL 50 MG PO TABS: 50.0000 mg | ORAL_TABLET | Freq: Every day | ORAL | 3 refills | Status: DC

## 2020-01-20 MED ORDER — CHLORTHALIDONE 25 MG PO TABS: 25.0000 mg | ORAL_TABLET | Freq: Every day | ORAL | 3 refills | Status: DC

## 2020-01-20 MED ORDER — ESCITALOPRAM OXALATE 10 MG PO TABS: 10.0000 mg | ORAL_TABLET | Freq: Every day | ORAL | 3 refills | Status: DC

## 2020-01-20 MED ORDER — TRAZODONE HCL 50 MG PO TABS: 25.0000 mg | ORAL_TABLET | Freq: Every evening | ORAL | 3 refills | Status: DC | PRN

## 2020-01-20 NOTE — Progress Notes (Signed)
Name: Tammy Garcia   MRN: [id number]    DOB: 1966-03-10   Date:01/20/2020       Progress Note  Chief Complaint  Patient presents with  . Follow-up  . Medication Refill  . Depression  . Hypertension  . Insomnia     Subjective:   Tammy Garcia is a 54 y.o. female, presents to clinic for routine follow up on the conditions listed above.  Hypertension:  Currently managed on atenolol and chlorthalidone Pt reports good med compliance and denies any SE.  No lightheadedness, hypotension, syncope. Blood pressure today is well controlled. BP Readings from Last 3 Encounters:  01/20/20 118/78  08/17/19 124/82  06/04/19 118/72   Pt denies CP, SOB, exertional sx, LE edema, palpitation, Ha's, visual disturbances  Current smoker HLD, not currently on meds: Hyperlipidemia: Last Lipids: Lab Results  Component Value Date   CHOL 227 (H) 08/17/2019   HDL 71 08/17/2019   LDLCALC 137 (H) 08/17/2019   TRIG 93 08/17/2019   CHOLHDL 3.2 08/17/2019  discussed ASCVD risk today - Risk factors for atherosclerosis: hypercholesterolemia, hypertension and smoking The 10-year ASCVD risk score Mikey Bussing DC Jr., et al., 2013) is: 4.3%   Values used to calculate the score:     Age: 77 years     Sex: Female     Is Non-Hispanic African American: Yes     Diabetic: No     Tobacco smoker: Yes     Systolic Blood Pressure: 081 mmHg     Is BP treated: Yes     HDL Cholesterol: 71 mg/dL     Total Cholesterol: 227 mg/dL  Discussed smoking cessation today  F/up on anxiety, depression and insomnia: Past hx with PCP 08/2019: Anxiety and Insomnia:  She started Lexapro 5mg  about 2 months ago and feels like she could use a higher dose - anxiety is still not well controlled, though slightly improved.  She also notes breakthrough anxiety in the middle of the night and sometimes during the day - will add hydroxyzine PRN. - Has never really had an issue with anxiety or insomniaexceptfor the last severalfew months  - notes LMP was September 2020 and thinks she is going through menopause at this time. Shedoes not haveissues falling asleep, but has been waking up after 3-4 hours and can't go back to sleep because she is feeling anxious and negative emotions- she has been trying trazodone at 50mg  and it does help her to fall asleep, but not stay asleep.She has never gone to counseling. 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   Lexapro was increased to 10 mg - plan to refer to gyn if hot flashes/mood not well controlled Vistaril 10 mg TID prn Trazodone for sleep Depression screen [facility] 2/9 01/20/2020 11/30/2019 09/06/2019  Decreased Interest 0 0 0  Down, Depressed, Hopeless 0 0 0  PHQ - 2 Score 0 0 0  Altered sleeping 1 0 0  Tired, decreased energy 0 0 0  Change in appetite 1 0 0  Feeling bad or failure about yourself  0 0 0  Trouble concentrating 0 0 0  Moving slowly or fidgety/restless 0 0 0  Suicidal thoughts 0 0 0  PHQ-9 Score 2 0 0  Difficult doing work/chores Somewhat difficult Not difficult at all Not difficult at all  Patient Active Problem List   Diagnosis Date Noted  . Pure hypercholesterolemia 11/30/2019  . Insomnia 11/30/2019  . Anxiety 11/30/2019  . Tobacco use 11/30/2019  . Onychomycosis with ingrown toenail 12/14/2018  . Perimenopausal vasomotor symptoms 12/14/2018  . Hypertensive disorder 10/08/2018    Past Surgical History:  Procedure Laterality Date  . TUBAL LIGATION      Family History  Problem Relation Age of Onset  . Cirrhosis Father   . Hyperlipidemia Sister   . Diabetes Maternal Aunt   . Kidney disease Maternal Aunt   . Hypertension Maternal Aunt   . Breast cancer Neg Hx     Social History   Tobacco Use  . Smoking status: Current Some Day Smoker  . Smokeless tobacco: Never Used  Vaping Use  . Vaping Use:  Never used  Substance Use Topics  . Alcohol use: Yes  . Drug use: Yes    Types: Marijuana      Current Outpatient Medications:  .  atenolol (TENORMIN) 50 MG tablet, Take 1 tablet (50 mg total) by mouth daily., Disp: 90 tablet, Rfl: 1 .  chlorthalidone (HYGROTON) 25 MG tablet, Take 1 tablet (25 mg total) by mouth daily., Disp: 90 tablet, Rfl: 1 .  escitalopram (LEXAPRO) 10 MG tablet, Take 1 tablet (10 mg total) by mouth daily., Disp: 90 tablet, Rfl: 1 .  traZODone (DESYREL) 50 MG tablet, Take 0.5-1 tablets (25-50 mg total) by mouth at bedtime as needed for sleep., Disp: 90 tablet, Rfl: 1  No Known Allergies  Chart Review Today: I personally reviewed active problem list, medication list, allergies, family history, social history, health maintenance, notes from last encounter, lab results, imaging with the patient/caregiver today.   Review of Systems  10 Systems reviewed and are negative for acute change except as noted in the HPI.  Objective:    Vitals:   01/20/20 1407  BP: 118/78  Pulse: 74  Resp: 14  Temp: 98.3 F (36.8 C)  SpO2: 98%  Weight: 146 lb (66.2 kg)  Height: 5\' 2"  (1.575 m)    Body mass index is 26.7 kg/m.  Physical Exam Vitals and nursing note reviewed.  Constitutional:      General: She is not in acute distress.    Appearance: Normal appearance. She is well-developed. She is not ill-appearing, toxic-appearing or diaphoretic.     Interventions: Face mask in place.  HENT:     Head: Normocephalic and atraumatic.     Right Ear: External ear normal.     Left Ear: External ear normal.  Eyes:     General: Lids are normal. No scleral icterus.       Right eye: No discharge.        Left eye: No discharge.     Conjunctiva/sclera: Conjunctivae normal.  Neck:     Trachea: Phonation normal. No tracheal deviation.  Cardiovascular:     Rate and Rhythm: Normal rate and regular rhythm.     Pulses: Normal pulses.          Radial pulses are 2+ on the right side  and 2+ on the left side.       Posterior tibial pulses are 2+ on the right side and 2+ on the left side.     Heart sounds: Normal heart sounds. No murmur heard.  No friction rub. No gallop.   Pulmonary:     Effort: Pulmonary effort is normal. No respiratory distress.     Breath sounds: Normal breath sounds. No stridor.  No wheezing, rhonchi or rales.  Chest:     Chest wall: No tenderness.  Abdominal:     General: Bowel sounds are normal. There is no distension.     Palpations: Abdomen is soft.     Tenderness: There is no abdominal tenderness. There is no guarding or rebound.  Musculoskeletal:        General: No deformity. Normal range of motion.     Cervical back: Normal range of motion and neck supple.     Right lower leg: No edema.     Left lower leg: No edema.  Lymphadenopathy:     Cervical: No cervical adenopathy.  Skin:    General: Skin is warm and dry.     Capillary Refill: Capillary refill takes less than 2 seconds.     Coloration: Skin is not jaundiced or pale.     Findings: No rash.  Neurological:     Mental Status: She is alert and oriented to person, place, and time.     Motor: No abnormal muscle tone.     Gait: Gait normal.  Psychiatric:        Speech: Speech normal.        Behavior: Behavior normal.       Diabetic Foot Exam: Diabetic Foot Exam - Simple   No data filed      PHQ2/9: Depression screen [facility] 2/9 01/20/2020 11/30/2019 09/06/2019 08/17/2019 07/14/2019  Decreased Interest 0 0 0 0 0  Down, Depressed, Hopeless 0 0 0 0 0  PHQ - 2 Score 0 0 0 0 0  Altered sleeping 1 0 0 0 0  Tired, decreased energy 0 0 0 0 0  Change in appetite 1 0 0 0 0  Feeling bad or failure about yourself  0 0 0 0 0  Trouble concentrating 0 0 0 0 0  Moving slowly or fidgety/restless 0 0 0 0 0  Suicidal thoughts 0 0 0 0 0  PHQ-9 Score 2 0 0 0 0  Difficult doing work/chores Somewhat difficult Not difficult at all Not difficult at all Not difficult at all Not difficult at all    phq  9 is neg, reviewed  Fall Risk: Fall Risk  01/20/2020 11/30/2019 09/06/2019 08/17/2019 07/14/2019  Falls in the past year? 1 1 0 0 0  Number falls in past yr: 1 1 0 0 0  Injury with Fall? 0 1 0 0 0  Follow up - - - Falls evaluation completed Falls evaluation completed    Functional Status Survey: Is the patient deaf or have difficulty hearing?: No Does the patient have difficulty seeing, even when wearing glasses/contacts?: Yes Does the patient have difficulty concentrating, remembering, or making decisions?: No Does the patient have difficulty walking or climbing stairs?: No Does the patient have difficulty dressing or bathing?: No   Assessment & Plan:     ICD-10-CM   1. Essential hypertension  I10 atenolol (TENORMIN) 50 MG tablet    chlorthalidone (HYGROTON) 25 MG tablet   stable well controlled  2. Insomnia, unspecified type  G47.00 escitalopram (LEXAPRO) 10 MG tablet    traZODone (DESYREL) 50 MG tablet   some insomnia, discussed sleep hygiene, trazodone trial  3. Anxiety  F41.9 escitalopram (LEXAPRO) 10 MG tablet    traZODone (DESYREL) 50 MG tablet   lexapro 10 mg, pt would like to continue same dose, feels sx are improving, encouraged still connecting with counselor  4. Perimenopausal  N95.1 escitalopram (LEXAPRO) 10 MG tablet  5. Encounter  for screening mammogram for malignant neoplasm of breast  Z12.31 MM 3D SCREEN BREAST BILATERAL  6. Screen for colon cancer  Z12.11 Ambulatory referral to Gastroenterology     Return for CPE and routine f/up after 08/16/2020.   Delsa Grana, PA-C 01/20/20 2:23 PM

## 2020-02-17 ENCOUNTER — Telehealth: Payer: Self-pay

## 2020-02-17 ENCOUNTER — Telehealth: Payer: 59

## 2020-02-17 NOTE — Telephone Encounter (Signed)
Unable to contact patient this morning to schedule her colonoscopy.  She was scheduled for a 8:30am nurse call to triage and schedule.  LVM for her to call office back to reschedule.  Thanks,  [place], [place]

## 2020-02-22 ENCOUNTER — Encounter: Payer: Self-pay | Admitting: Family Medicine

## 2020-02-24 ENCOUNTER — Telehealth (INDEPENDENT_AMBULATORY_CARE_PROVIDER_SITE_OTHER): Payer: Self-pay | Admitting: Gastroenterology

## 2020-02-24 ENCOUNTER — Other Ambulatory Visit: Payer: Self-pay

## 2020-02-24 DIAGNOSIS — Z1211 Encounter for screening for malignant neoplasm of colon: Secondary | ICD-10-CM

## 2020-02-24 MED ORDER — PEG 3350-KCL-NA BICARB-NACL 420 G PO SOLR
4000.0000 mL | Freq: Once | ORAL | 0 refills | Status: AC
Start: 2020-02-24 — End: 2020-02-24

## 2020-02-24 NOTE — Progress Notes (Signed)
Gastroenterology Pre-Procedure Review  Request Date: Thursday 03/16/20 Requesting Physician: Dr. Allen Norris  PATIENT REVIEW QUESTIONS: The patient responded to the following health history questions as indicated:    1. Are you having any GI issues? no 2. Do you have a personal history of Polyps? no 3. Do you have a family history of Colon Cancer or Polyps? no 4. Diabetes Mellitus? no 5. Joint replacements in the past 12 months?no 6. Major health problems in the past 3 months?no 7. Any artificial heart valves, MVP, or defibrillator?no    MEDICATIONS & ALLERGIES:    Patient reports the following regarding taking any anticoagulation/antiplatelet therapy:   Plavix, Coumadin, Eliquis, Xarelto, Lovenox, Pradaxa, Brilinta, or Effient? no Aspirin? no  Patient confirms/reports the following medications:  Current Outpatient Medications  Medication Sig Dispense Refill  . atenolol (TENORMIN) 50 MG tablet Take 1 tablet (50 mg total) by mouth daily. 90 tablet 3  . chlorthalidone (HYGROTON) 25 MG tablet Take 1 tablet (25 mg total) by mouth daily. 90 tablet 3  . escitalopram (LEXAPRO) 10 MG tablet Take 1 tablet (10 mg total) by mouth daily. 90 tablet 3  . traZODone (DESYREL) 50 MG tablet Take 0.5-1 tablets (25-50 mg total) by mouth at bedtime as needed for sleep. 90 tablet 3  . polyethylene glycol-electrolytes (NULYTELY) 420 g solution Take 4,000 mLs by mouth once for 1 dose. 4000 mL 0   No current facility-administered medications for this visit.    Patient confirms/reports the following allergies:  No Known Allergies  No orders of the defined types were placed in this encounter.   AUTHORIZATION INFORMATION Primary Insurance: 1D#: Group #:  Secondary Insurance: 1D#: Group #:  SCHEDULE INFORMATION: Date: 03/16/20 Time: Location:[facility]

## 2020-03-08 ENCOUNTER — Ambulatory Visit (INDEPENDENT_AMBULATORY_CARE_PROVIDER_SITE_OTHER): Payer: 59

## 2020-03-08 ENCOUNTER — Telehealth: Payer: Self-pay | Admitting: Emergency Medicine

## 2020-03-08 ENCOUNTER — Ambulatory Visit
Admission: EM | Admit: 2020-03-08 | Discharge: 2020-03-08 | Disposition: A | Discharge status: Home / Self Care | Payer: 59 | Attending: Emergency Medicine | Admitting: Emergency Medicine

## 2020-03-08 ENCOUNTER — Other Ambulatory Visit: Payer: Self-pay

## 2020-03-08 ENCOUNTER — Encounter: Payer: Self-pay | Admitting: Emergency Medicine

## 2020-03-08 DIAGNOSIS — M5136 Other intervertebral disc degeneration, lumbar region: Secondary | ICD-10-CM | POA: Insufficient documentation

## 2020-03-08 DIAGNOSIS — N390 Urinary tract infection, site not specified: Secondary | ICD-10-CM | POA: Insufficient documentation

## 2020-03-08 DIAGNOSIS — M5442 Lumbago with sciatica, left side: Secondary | ICD-10-CM | POA: Diagnosis not present

## 2020-03-08 DIAGNOSIS — R319 Hematuria, unspecified: Secondary | ICD-10-CM | POA: Diagnosis present

## 2020-03-08 LAB — URINALYSIS, COMPLETE (UACMP) WITH MICROSCOPIC
Bilirubin Urine: NEGATIVE
Glucose, UA: NEGATIVE mg/dL
Ketones, ur: NEGATIVE mg/dL
Nitrite: POSITIVE — AB
Protein, ur: NEGATIVE mg/dL
RBC / HPF: NONE SEEN RBC/hpf (ref 0–5)
Specific Gravity, Urine: 1.025 (ref 1.005–1.030)
WBC, UA: >50 WBC/hpf (ref 0–5)
pH: 6.5 (ref 5.0–8.0)

## 2020-03-08 MED ORDER — METHYLPREDNISOLONE 4 MG PO TBPK
ORAL_TABLET | Freq: Every day | ORAL | 0 refills | Status: DC
Start: 2020-03-08 — End: 2021-01-15

## 2020-03-08 MED ORDER — KETOROLAC TROMETHAMINE 60 MG/2ML IM SOLN
30.0000 mg | Freq: Once | INTRAMUSCULAR | Status: AC
  Administered 2020-03-08: 30 mg via INTRAMUSCULAR

## 2020-03-08 MED ORDER — TIZANIDINE HCL 4 MG PO TABS
4.0000 mg | ORAL_TABLET | Freq: Three times a day (TID) | ORAL | 0 refills | Status: DC | PRN
Start: 2020-03-08 — End: 2021-01-15

## 2020-03-08 MED ORDER — IBUPROFEN 600 MG PO TABS
600.0000 mg | ORAL_TABLET | Freq: Four times a day (QID) | ORAL | 0 refills | Status: DC | PRN
Start: 2020-03-08 — End: 2021-01-15

## 2020-03-08 MED ORDER — CEPHALEXIN 500 MG PO CAPS
500.0000 mg | ORAL_CAPSULE | Freq: Two times a day (BID) | ORAL | 0 refills | Status: DC
Start: 2020-03-08 — End: 2021-01-15

## 2020-03-08 MED ORDER — ACETAMINOPHEN 500 MG PO TABS
1000.0000 mg | ORAL_TABLET | Freq: Once | ORAL | Status: AC
  Administered 2020-03-08: 1000 mg via ORAL

## 2020-03-08 NOTE — Discharge Instructions (Addendum)
I will contact you only if your urinalysis is abnormal.  If you do not hear from me by the end of today or tomorrow morning, then your urinalysis is normal.  Take 600 mg of ibuprofen combined with 1000 g of Tylenol together 3-4 times a day as needed for pain.  Zanaflex for muscle spasms and Medrol Dosepak for pain and swelling, particularly of the sciatic nerve.  Follow-up with your doctor or with orthopedics if you are not getting better in a week.

## 2020-03-08 NOTE — Telephone Encounter (Signed)
Contacted patient per Dr. Alphonzo Cruise to notify her that her urine showed at UTI. Antibiotic sent to [facility] in [place]. Patient verbalized understanding.

## 2020-03-08 NOTE — ED Triage Notes (Signed)
Pt c/o left sided lower back pain. Started about 2 days ago. She states this morning it started radiating down her left leg and making it feel. She has been taking ibuprofen. No known injuries. She states when she bends over she can fell it "catch"

## 2020-03-08 NOTE — ED Provider Notes (Addendum)
HPI  SUBJECTIVE:  Tammy Garcia is a 54 y.o. female who presents with 2 days of constant left low back pain described as dull, achy, becoming sharp with movement and walking.  Gets better/worse depending on her activity, movement.  She states that it radiates down her posterior left leg.  She reports leg weakness secondary to pain.  She denies true weakness.  No recent trauma to the back.  No heavy lifting, although she pushes large heavy objects at work.  She denies repetitive activity.  No vomiting, fevers, urinary complaints, abdominal pain, saddle anesthesia.  No urinary or fecal incontinence, urinary retention.  No pain worse at night.  No unintentional weight loss, night sweats, syncope.  She has never had symptoms like this before.  No antipyretic in the past 6 hours.  She tried ibuprofen 800 mg 3 times daily with significant improvement in her symptoms.  Symptoms worse with bending forward, or walking around.  She has a past medical history of cervical cancer that was treated with cryotherapy, hypertension.  No history of pyelonephritis, nephrolithiasis, diabetes, abdominal aortic aneurysm, MS, osteoporosis, prolonged steroid use, IVDU.  PXT:GGYIR, Astrid Divine, FNP   Past Medical History:  Diagnosis Date  . Cancer ([place]) cervix   froze cells  . Hypertension   . Inner ear disease   . Motion sickness    boats  . Multilevel degenerative disc disease     Past Surgical History:  Procedure Laterality Date  . TUBAL LIGATION      Family History  Problem Relation Age of Onset  . Cirrhosis Father   . Hyperlipidemia Sister   . Diabetes Maternal Aunt   . Kidney disease Maternal Aunt   . Hypertension Maternal Aunt   . Breast cancer Neg Hx     Social History   Tobacco Use  . Smoking status: Current Every Day Smoker    Packs/day: 0.15    Years: 10.00    Pack years: 1.50    Types: Cigarettes  . Smokeless tobacco: Never Used  . Tobacco comment: 3 cigs/day  Vaping Use  . Vaping Use:  Never used  Substance Use Topics  . Alcohol use: Yes    Alcohol/week: 7.0 standard drinks    Types: 7 Cans of beer per week  . Drug use: Yes    Types: Marijuana    No current facility-administered medications for this encounter.  Current Outpatient Medications:  .  atenolol (TENORMIN) 50 MG tablet, Take 1 tablet (50 mg total) by mouth daily., Disp: 90 tablet, Rfl: 3 .  chlorthalidone (HYGROTON) 25 MG tablet, Take 1 tablet (25 mg total) by mouth daily., Disp: 90 tablet, Rfl: 3 .  escitalopram (LEXAPRO) 10 MG tablet, Take 1 tablet (10 mg total) by mouth daily., Disp: 90 tablet, Rfl: 3 .  traZODone (DESYREL) 50 MG tablet, Take 0.5-1 tablets (25-50 mg total) by mouth at bedtime as needed for sleep., Disp: 90 tablet, Rfl: 3 .  cephALEXin (KEFLEX) 500 MG capsule, Take 1 capsule (500 mg total) by mouth 2 (two) times daily., Disp: 14 capsule, Rfl: 0 .  ibuprofen (ADVIL) 600 MG tablet, Take 1 tablet (600 mg total) by mouth every 6 (six) hours as needed., Disp: 30 tablet, Rfl: 0 .  methylPREDNISolone (MEDROL DOSEPAK) 4 MG TBPK tablet, Take by mouth daily. Follow package instructions, Disp: 21 tablet, Rfl: 0 .  tiZANidine (ZANAFLEX) 4 MG tablet, Take 1 tablet (4 mg total) by mouth every 8 (eight) hours as needed for muscle spasms., Disp: 30  tablet, Rfl: 0  No Known Allergies   ROS  As noted in HPI.   Physical Exam  BP 126/83 (BP Location: Right Arm)   Pulse 64   Temp 98.1 F (36.7 C) (Oral)   Resp 18   Ht 5\' 2"  (1.575 m)   Wt 66.2 kg   SpO2 100%   BMI 26.69 kg/m   Constitutional: Well developed, well nourished, no acute distress Eyes:  EOMI, conjunctiva normal bilaterally HENT: Normocephalic, atraumatic,mucus membranes moist Respiratory: Normal inspiratory effort Cardiovascular: Normal rate GI: nondistended. No suprapubic, flank tenderness skin: No rash, skin intact Musculoskeletal: questionable L CVAT. + L paralumbar tenderness, + L QL tenderness  - appreciable muscle spasm. No  L spine bony tenderness. Bilateral lower extremities nontender, baseline ROM with intact DP  pulses,No pain with int/ext rotation flex/extension hips bilaterally. SLR positive left side. Sensation baseline light touch bilaterally for Pt, DTR's symmetric and intact bilaterally KJ, Motor symmetric bilateral 5/5 hip flexion, quadriceps, hamstrings, EHL, foot dorsiflexion, foot plantarflexion, pain aggravated by movement. Gait normal. Neurologic: Alert & oriented x 3, no focal neuro deficits Psychiatric: Speech and behavior appropriate   ED Course   Medications  ketorolac (TORADOL) injection 30 mg (30 mg Intramuscular Given 03/08/20 0927)  acetaminophen (TYLENOL) tablet 1,000 mg (1,000 mg Oral Given 03/08/20 0926)    Orders Placed This Encounter  Procedures  . DG Lumbar Spine Complete    Standing Status:   Standing    Number of Occurrences:   1    Order Specific Question:   Reason for Exam (SYMPTOM  OR DIAGNOSIS REQUIRED)    Answer:   L LBP h/o CA    Order Specific Question:   Is patient pregnant?    Answer:   No  . Urinalysis, Complete w Microscopic    Standing Status:   Standing    Number of Occurrences:   1    No results found for this or any previous visit (from the past 24 hour(s)). No results found.  ED Clinical Impression  1. Acute left-sided low back pain with left-sided sciatica   2. Degenerative disc disease, lumbar   3. Urinary tract infection with hematuria, site unspecified     ED Assessment/Plan   Given history of cancer, will x-ray L-spine.  Suspect that this is musculoskeletal with sciatica.  However she does have some mild left CVA tenderness which could be referred pain but will check a UA as well.  Giving Toradol 30 IM and Tylenol 1000 mg IM.  Imaging independently reviewed.  Moderate disc space narrowing at L5-S1.  Facet arthropathy at L4/5 and L5/S1 bilaterally.  No fracture or spondylolisthesis.  See radiology report for details.  Pt with significant  reduction in pain after pain meds.  States symptoms have completely resolved.  Pt ambulatory in the UC. Home with ibuprofen/Tylenol, Zanaflex, Medrol Dosepak.  Pt to f/u with ortho spine or PMD.  Discussed labs, imaging, medical decision-making, and plan for follow-up with the patient.  Discussed signs and symptoms that should prompt return to the emergency department.  Patient agrees with plan.  Work note for 2 days  Urinalysis pending, however I have a low suspicion for nephrolithiasis.  Will contact patient only if abnormal.  UA noted.  She has a urinary tract infection with a mild CVA tenderness.  Will treat this with Keflex 7 days.  This was E prescribed to pharmacy on record.  I do not think this is pyelonephritis in the absence of systemic symptoms, fevers.  Urine culture sent.  Will have staff notify patient of urine results and plan to be treated with Keflex.  7/30-addendum: Urine culture was canceled by epic.  will have lab check to see if they still have a sample, and if so, will send that off, if not, staff will call patient tomorrow and see how she is doing clinically.  Meds ordered this encounter  Medications  . ketorolac (TORADOL) injection 30 mg  . acetaminophen (TYLENOL) tablet 1,000 mg  . ibuprofen (ADVIL) 600 MG tablet    Sig: Take 1 tablet (600 mg total) by mouth every 6 (six) hours as needed.    Dispense:  30 tablet    Refill:  0  . tiZANidine (ZANAFLEX) 4 MG tablet    Sig: Take 1 tablet (4 mg total) by mouth every 8 (eight) hours as needed for muscle spasms.    Dispense:  30 tablet    Refill:  0  . methylPREDNISolone (MEDROL DOSEPAK) 4 MG TBPK tablet    Sig: Take by mouth daily. Follow package instructions    Dispense:  21 tablet    Refill:  0  . cephALEXin (KEFLEX) 500 MG capsule    Sig: Take 1 capsule (500 mg total) by mouth 2 (two) times daily.    Dispense:  14 capsule    Refill:  0    *This clinic note was created using [employer]. Therefore,  there may be occasional mistakes despite careful proofreading.  ?    Melynda Ripple, MD 03/10/20 1314    Melynda Ripple, MD 03/10/20 [phone]

## 2020-03-09 ENCOUNTER — Other Ambulatory Visit: Payer: Self-pay

## 2020-03-09 ENCOUNTER — Encounter: Payer: Self-pay | Admitting: Gastroenterology

## 2020-03-10 ENCOUNTER — Telehealth: Payer: Self-pay | Admitting: Emergency Medicine

## 2020-03-10 NOTE — Telephone Encounter (Signed)
Dr. Alphonzo Cruise called and wanted to know why the urine culture for this patient was canceled.  Spoke to Lab and they still have the urine to send for culture.  Urine culture order will be placed and urine processed for culture.

## 2020-03-11 LAB — URINE CULTURE

## 2020-03-14 ENCOUNTER — Other Ambulatory Visit: Payer: Self-pay

## 2020-03-14 ENCOUNTER — Other Ambulatory Visit
Admission: RE | Admit: 2020-03-14 | Discharge: 2020-03-14 | Disposition: A | Discharge status: Home / Self Care | Payer: 59 | Source: Ambulatory Visit | Attending: Gastroenterology | Admitting: Gastroenterology

## 2020-03-14 DIAGNOSIS — Z01812 Encounter for preprocedural laboratory examination: Secondary | ICD-10-CM | POA: Insufficient documentation

## 2020-03-14 DIAGNOSIS — Z20822 Contact with and (suspected) exposure to covid-19: Secondary | ICD-10-CM | POA: Insufficient documentation

## 2020-03-14 LAB — SARS CORONAVIRUS 2 (TAT 6-24 HRS): SARS Coronavirus 2: NEGATIVE

## 2020-03-15 NOTE — Discharge Instructions (Signed)
General Anesthesia, Adult, Care After This sheet gives you information about how to care for yourself after your procedure. Your health care provider may also give you more specific instructions. If you have problems or questions, contact your health care provider. What can I expect after the procedure? After the procedure, the following side effects are common:  Pain or discomfort at the IV site.  Nausea.  Vomiting.  Sore throat.  Trouble concentrating.  Feeling cold or chills.  Weak or tired.  Sleepiness and fatigue.  Soreness and body aches. These side effects can affect parts of the body that were not involved in surgery. Follow these instructions at home:  For at least 24 hours after the procedure:  Have a responsible adult stay with you. It is important to have someone help care for you until you are awake and alert.  Rest as needed.  Do not: ? Participate in activities in which you could fall or become injured. ? Drive. ? Use heavy machinery. ? Drink alcohol. ? Take sleeping pills or medicines that cause drowsiness. ? Make important decisions or sign legal documents. ? Take care of children on your own. Eating and drinking  Follow any instructions from your health care provider about eating or drinking restrictions.  When you feel hungry, start by eating small amounts of foods that are soft and easy to digest (bland), such as toast. Gradually return to your regular diet.  Drink enough fluid to keep your urine pale yellow.  If you vomit, rehydrate by drinking water, juice, or clear broth. General instructions  If you have sleep apnea, surgery and certain medicines can increase your risk for breathing problems. Follow instructions from your health care provider about wearing your sleep device: ? Anytime you are sleeping, including during daytime naps. ? While taking prescription pain medicines, sleeping medicines, or medicines that make you drowsy.  Return to  your normal activities as told by your health care provider. Ask your health care provider what activities are safe for you.  Take over-the-counter and prescription medicines only as told by your health care provider.  If you smoke, do not smoke without supervision.  Keep all follow-up visits as told by your health care provider. This is important. Contact a health care provider if:  You have nausea or vomiting that does not get better with medicine.  You cannot eat or drink without vomiting.  You have pain that does not get better with medicine.  You are unable to pass urine.  You develop a skin rash.  You have a fever.  You have redness around your IV site that gets worse. Get help right away if:  You have difficulty breathing.  You have chest pain.  You have blood in your urine or stool, or you vomit blood. Summary  After the procedure, it is common to have a sore throat or nausea. It is also common to feel tired.  Have a responsible adult stay with you for the first 24 hours after general anesthesia. It is important to have someone help care for you until you are awake and alert.  When you feel hungry, start by eating small amounts of foods that are soft and easy to digest (bland), such as toast. Gradually return to your regular diet.  Drink enough fluid to keep your urine pale yellow.  Return to your normal activities as told by your health care provider. Ask your health care provider what activities are safe for you. This information is not   intended to replace advice given to you by your health care provider. Make sure you discuss any questions you have with your health care provider. Document Revised: 08/01/2017 Document Reviewed: 03/14/2017 Elsevier Patient Education  2020 Elsevier Inc.  

## 2020-03-16 ENCOUNTER — Encounter
Admission: RE | Disposition: A | Discharge status: Home / Self Care | Payer: Self-pay | Source: Home / Self Care | Attending: Gastroenterology

## 2020-03-16 ENCOUNTER — Other Ambulatory Visit: Payer: Self-pay

## 2020-03-16 ENCOUNTER — Ambulatory Visit: Payer: 59 | Admitting: Anesthesiology

## 2020-03-16 ENCOUNTER — Other Ambulatory Visit: Payer: Self-pay | Admitting: Gastroenterology

## 2020-03-16 ENCOUNTER — Encounter: Payer: Self-pay | Admitting: Gastroenterology

## 2020-03-16 ENCOUNTER — Ambulatory Visit
Admission: RE | Admit: 2020-03-16 | Discharge: 2020-03-16 | Disposition: A | Discharge status: Home / Self Care | Payer: 59 | Attending: Gastroenterology | Admitting: Gastroenterology

## 2020-03-16 DIAGNOSIS — Z79899 Other long term (current) drug therapy: Secondary | ICD-10-CM | POA: Insufficient documentation

## 2020-03-16 DIAGNOSIS — K635 Polyp of colon: Secondary | ICD-10-CM | POA: Insufficient documentation

## 2020-03-16 DIAGNOSIS — I10 Essential (primary) hypertension: Secondary | ICD-10-CM | POA: Insufficient documentation

## 2020-03-16 DIAGNOSIS — Z8541 Personal history of malignant neoplasm of cervix uteri: Secondary | ICD-10-CM | POA: Diagnosis not present

## 2020-03-16 DIAGNOSIS — K640 First degree hemorrhoids: Secondary | ICD-10-CM | POA: Diagnosis not present

## 2020-03-16 DIAGNOSIS — F17210 Nicotine dependence, cigarettes, uncomplicated: Secondary | ICD-10-CM | POA: Diagnosis not present

## 2020-03-16 DIAGNOSIS — Z1211 Encounter for screening for malignant neoplasm of colon: Secondary | ICD-10-CM | POA: Insufficient documentation

## 2020-03-16 DIAGNOSIS — F419 Anxiety disorder, unspecified: Secondary | ICD-10-CM | POA: Insufficient documentation

## 2020-03-16 HISTORY — DX: Motion sickness, initial encounter: T75.3XXA

## 2020-03-16 HISTORY — PX: POLYPECTOMY: SHX5525

## 2020-03-16 HISTORY — PX: COLONOSCOPY WITH PROPOFOL: SHX5780

## 2020-03-16 HISTORY — DX: Dorsopathy, unspecified: M53.9

## 2020-03-16 LAB — POCT PREGNANCY, URINE: Preg Test, Ur: NEGATIVE

## 2020-03-16 SURGERY — COLONOSCOPY WITH PROPOFOL
Anesthesia: General | Site: Rectum

## 2020-03-16 MED ORDER — SODIUM CHLORIDE 0.9 % IV SOLN: INTRAVENOUS | Status: DC

## 2020-03-16 MED ORDER — LACTATED RINGERS IV SOLN: INTRAVENOUS | Status: DC

## 2020-03-16 MED ORDER — LIDOCAINE HCL (CARDIAC) PF 100 MG/5ML IV SOSY
PREFILLED_SYRINGE | INTRAVENOUS | Status: DC | PRN
  Administered 2020-03-16: 30 mg via INTRAVENOUS

## 2020-03-16 MED ORDER — PROPOFOL 10 MG/ML IV BOLUS
INTRAVENOUS | Status: DC | PRN
  Administered 2020-03-16: 50 mg via INTRAVENOUS
  Administered 2020-03-16 (×2): 30 mg via INTRAVENOUS
  Administered 2020-03-16: 150 mg via INTRAVENOUS
  Administered 2020-03-16: 30 mg via INTRAVENOUS

## 2020-03-16 MED ORDER — STERILE WATER FOR IRRIGATION IR SOLN: Status: DC | PRN

## 2020-03-16 SURGICAL SUPPLY — 6 items
FORCEPS BIOP RAD 4 LRG CAP 4 (CUTTING FORCEPS) ×3 IMPLANT
GOWN CVR UNV OPN BCK APRN NK (MISCELLANEOUS) ×2 IMPLANT
GOWN ISOL THUMB LOOP REG UNIV (MISCELLANEOUS) ×6
KIT ENDO PROCEDURE OLY (KITS) ×3 IMPLANT
MANIFOLD NEPTUNE II (INSTRUMENTS) ×3 IMPLANT
WATER STERILE IRR 250ML POUR (IV SOLUTION) ×3 IMPLANT

## 2020-03-16 NOTE — Anesthesia Postprocedure Evaluation (Signed)
Anesthesia Post Note  Patient: Tammy Garcia  Procedure(s) Performed: COLONOSCOPY WITH BIOPSY (N/A Rectum) POLYPECTOMY (N/A Rectum)     Patient location during evaluation: PACU Anesthesia Type: General Level of consciousness: awake Pain management: pain level controlled Vital Signs Assessment: post-procedure vital signs reviewed and stable Respiratory status: respiratory function stable Cardiovascular status: stable Postop Assessment: no signs of nausea or vomiting Anesthetic complications: no   No complications documented.  Veda Canning

## 2020-03-16 NOTE — H&P (Signed)
Lucilla Lame, MD [address] [place], [place] [address] Phone: [phone] Fax : [phone]  Primary Care Physician:  Delsa Grana, PA-C Primary Gastroenterologist:  Dr. Allen Norris  Pre-Procedure History & Physical: HPI:  Tammy Garcia is a 54 y.o. female is here for a screening colonoscopy.   Past Medical History:  Diagnosis Date  . Cancer ([place]) cervix   froze cells  . Hypertension   . Inner ear disease   . Motion sickness    boats  . Multilevel degenerative disc disease     Past Surgical History:  Procedure Laterality Date  . TUBAL LIGATION      Prior to Admission medications   Medication Sig Start Date End Date Taking? Authorizing Provider  atenolol (TENORMIN) 50 MG tablet Take 1 tablet (50 mg total) by mouth daily. 01/20/20  Yes Delsa Grana, PA-C  cephALEXin (KEFLEX) 500 MG capsule Take 1 capsule (500 mg total) by mouth 2 (two) times daily. 03/08/20  Yes Melynda Ripple, MD  chlorthalidone (HYGROTON) 25 MG tablet Take 1 tablet (25 mg total) by mouth daily. 01/20/20  Yes Delsa Grana, PA-C  escitalopram (LEXAPRO) 10 MG tablet Take 1 tablet (10 mg total) by mouth daily. 01/20/20  Yes Delsa Grana, PA-C  ibuprofen (ADVIL) 600 MG tablet Take 1 tablet (600 mg total) by mouth every 6 (six) hours as needed. 03/08/20  Yes Melynda Ripple, MD  methylPREDNISolone (MEDROL DOSEPAK) 4 MG TBPK tablet Take by mouth daily. Follow package instructions 03/08/20  Yes Melynda Ripple, MD  tiZANidine (ZANAFLEX) 4 MG tablet Take 1 tablet (4 mg total) by mouth every 8 (eight) hours as needed for muscle spasms. 03/08/20  Yes Melynda Ripple, MD  traZODone (DESYREL) 50 MG tablet Take 0.5-1 tablets (25-50 mg total) by mouth at bedtime as needed for sleep. 01/20/20  Yes Delsa Grana, PA-C  venlafaxine (EFFEXOR) 37.5 MG tablet Take 1 tablet once daily x7 days, then increase to 1 tablet twice daily 12/14/18 02/23/19  Hubbard Hartshorn, FNP    Allergies as of 02/24/2020  . (No Known  Allergies)    Family History  Problem Relation Age of Onset  . Cirrhosis Father   . Hyperlipidemia Sister   . Diabetes Maternal Aunt   . Kidney disease Maternal Aunt   . Hypertension Maternal Aunt   . Breast cancer Neg Hx     Social History   Socioeconomic History  . Marital status: Significant Other    Spouse name: Not on file  . Number of children: 3  . Years of education: 44  . Highest education level: Not on file  Occupational History  . Not on file  Tobacco Use  . Smoking status: Current Every Day Smoker    Packs/day: 0.15    Years: 10.00    Pack years: 1.50    Types: Cigarettes  . Smokeless tobacco: Never Used  . Tobacco comment: 3 cigs/day  Vaping Use  . Vaping Use: Never used  Substance and Sexual Activity  . Alcohol use: Yes    Alcohol/week: 7.0 standard drinks    Types: 7 Cans of beer per week  . Drug use: Yes    Types: Marijuana  . Sexual activity: Yes  Other Topics Concern  . Not on file  Social History Narrative  . Not on file   Social Determinants of Health   Financial Resource Strain:   . Difficulty of Paying Living Expenses:   Food Insecurity:   . Worried About Charity fundraiser in  the Last Year:   . [place] in the Last Year:   Transportation Needs:   . Film/video editor (Medical):   Marland Kitchen Lack of Transportation (Non-Medical):   Physical Activity:   . Days of Exercise per Week:   . Minutes of Exercise per Session:   Stress:   . Feeling of Stress :   Social Connections:   . Frequency of Communication with Friends and Family:   . Frequency of Social Gatherings with Friends and Family:   . Attends Religious Services:   . Active Member of Clubs or Organizations:   . Attends Archivist Meetings:   Marland Kitchen Marital Status:   Intimate Partner Violence:   . Fear of Current or Ex-Partner:   . Emotionally Abused:   Marland Kitchen Physically Abused:   . Sexually Abused:     Review of Systems: See HPI, otherwise negative ROS  Physical  Exam: Ht 5\' 2"  (1.575 m)   Wt 66.2 kg   LMP 12/13/2019 (Approximate)   BMI 26.70 kg/m  General:   Alert,  pleasant and cooperative in NAD Head:  Normocephalic and atraumatic. Neck:  Supple; no masses or thyromegaly. Lungs:  Clear throughout to auscultation.    Heart:  Regular rate and rhythm. Abdomen:  Soft, nontender and nondistended. Normal bowel sounds, without guarding, and without rebound.   Neurologic:  Alert and  oriented x4;  grossly normal neurologically.  Impression/Plan: Tammy Garcia is now here to undergo a screening colonoscopy.  Risks, benefits, and alternatives regarding colonoscopy have been reviewed with the patient.  Questions have been answered.  All parties agreeable.

## 2020-03-16 NOTE — Op Note (Signed)
[facility] Patient Name: Tammy Garcia Procedure Date: 03/16/2020 10:37 AM MRN: [id number] Account #: [id number] Date of Birth: Apr 20, 1966 Admit Type: Outpatient Age: 54 Room: [facility] OR ROOM 01 Gender: Female Note Status: Finalized Procedure:             Colonoscopy Indications:           Screening for colorectal malignant neoplasm Providers:             Lucilla Lame MD, MD Referring MD:          Delsa Grana (Referring MD) Medicines:             Propofol per Anesthesia Complications:         No immediate complications. Procedure:             Pre-Anesthesia Assessment:                        - Prior to the procedure, a History and Physical was                         performed, and patient medications and allergies were                         reviewed. The patient's tolerance of previous                         anesthesia was also reviewed. The risks and benefits                         of the procedure and the sedation options and risks                         were discussed with the patient. All questions were                         answered, and informed consent was obtained. Prior                         Anticoagulants: The patient has taken no previous                         anticoagulant or antiplatelet agents. ASA Grade                         Assessment: II - A patient with mild systemic disease.                         After reviewing the risks and benefits, the patient                         was deemed in satisfactory condition to undergo the                         procedure.                        After obtaining informed consent, the colonoscope was  passed under direct vision. Throughout the procedure,                         the patient's blood pressure, pulse, and oxygen                         saturations were monitored continuously. The was                         introduced through the anus and advanced  to the the                         cecum, identified by appendiceal orifice and ileocecal                         valve. The colonoscopy was performed without                         difficulty. The patient tolerated the procedure well.                         The quality of the bowel preparation was excellent. Findings:      The perianal and digital rectal examinations were normal.      Three sessile polyps were found in the sigmoid colon. The polyps were 2       to 4 mm in size. These polyps were removed with a cold biopsy forceps.       Resection and retrieval were complete.      Non-bleeding internal hemorrhoids were found during retroflexion. The       hemorrhoids were Grade I (internal hemorrhoids that do not prolapse). Impression:            - Three 2 to 4 mm polyps in the sigmoid colon, removed                         with a cold biopsy forceps. Resected and retrieved.                        - Non-bleeding internal hemorrhoids. Recommendation:        - Discharge patient to home.                        - Resume previous diet.                        - Continue present medications.                        - Await pathology results.                        - Repeat colonoscopy in 7 years if polyp adenoma and                         10 years if hyperplastic Procedure Code(s):     --- Professional ---                        [phone], Colonoscopy, flexible; with biopsy, single or  multiple Diagnosis Code(s):     --- Professional ---                        Z12.11, Encounter for screening for malignant neoplasm                         of colon                        K63.5, Polyp of colon CPT copyright 2019 American Medical Association. All rights reserved. The codes documented in this report are preliminary and upon coder review may  be revised to meet current compliance requirements. Lucilla Lame MD, MD 03/16/2020 10:52:00 AM This report has been signed  electronically. Number of Addenda: 0 Note Initiated On: 03/16/2020 10:37 AM Scope Withdrawal Time: 0 hours 7 minutes 25 seconds  Total Procedure Duration: 0 hours 12 minutes 13 seconds  Estimated Blood Loss:  Estimated blood loss: none.      [facility]

## 2020-03-16 NOTE — Transfer of Care (Signed)
Immediate Anesthesia Transfer of Care Note  Patient: Tammy Garcia  Procedure(s) Performed: COLONOSCOPY WITH BIOPSY (N/A Rectum) POLYPECTOMY (N/A Rectum)  Patient Location: PACU  Anesthesia Type: General  Level of Consciousness: awake, alert  and patient cooperative  Airway and Oxygen Therapy: Patient Spontanous Breathing and Patient connected to supplemental oxygen  Post-op Assessment: Post-op Vital signs reviewed, Patient's Cardiovascular Status Stable, Respiratory Function Stable, Patent Airway and No signs of Nausea or vomiting  Post-op Vital Signs: Reviewed and stable  Complications: No complications documented.

## 2020-03-16 NOTE — Anesthesia Preprocedure Evaluation (Signed)
Anesthesia Evaluation  Patient identified by MRN, date of birth, ID band Patient awake    Reviewed: Allergy & Precautions, NPO status   Airway Mallampati: II  TM Distance: >3 FB     Dental   Pulmonary Current SmokerPatient did not abstain from smoking.,    breath sounds clear to auscultation       Cardiovascular hypertension,  Rhythm:Regular Rate:Normal     Neuro/Psych Anxiety    GI/Hepatic negative GI ROS,   Endo/Other  negative endocrine ROS  Renal/GU      Musculoskeletal   Abdominal   Peds  Hematology   Anesthesia Other Findings Hx cervical cancer  Reproductive/Obstetrics                             Anesthesia Physical Anesthesia Plan  ASA: II  Anesthesia Plan: General   Post-op Pain Management:    Induction: Intravenous  PONV Risk Score and Plan: Propofol infusion, TIVA and Treatment may vary due to age or medical condition  Airway Management Planned: Natural Airway and Nasal Cannula  Additional Equipment:   Intra-op Plan:   Post-operative Plan:   Informed Consent: I have reviewed the patients History and Physical, chart, labs and discussed the procedure including the risks, benefits and alternatives for the proposed anesthesia with the patient or authorized representative who has indicated his/her understanding and acceptance.       Plan Discussed with: CRNA  Anesthesia Plan Comments:         Anesthesia Quick Evaluation

## 2020-03-17 ENCOUNTER — Encounter: Payer: Self-pay | Admitting: Gastroenterology

## 2020-03-17 LAB — SURGICAL PATHOLOGY

## 2020-03-20 LAB — PATHOLOGY

## 2020-05-14 ENCOUNTER — Ambulatory Visit (INDEPENDENT_AMBULATORY_CARE_PROVIDER_SITE_OTHER): Payer: 59

## 2020-05-14 ENCOUNTER — Other Ambulatory Visit: Payer: Self-pay

## 2020-05-14 ENCOUNTER — Ambulatory Visit
Admission: EM | Admit: 2020-05-14 | Discharge: 2020-05-14 | Disposition: A | Discharge status: Home / Self Care | Payer: 59 | Attending: Physician Assistant | Admitting: Physician Assistant

## 2020-05-14 DIAGNOSIS — S99922A Unspecified injury of left foot, initial encounter: Secondary | ICD-10-CM

## 2020-05-14 DIAGNOSIS — M25572 Pain in left ankle and joints of left foot: Secondary | ICD-10-CM

## 2020-05-14 DIAGNOSIS — S9032XA Contusion of left foot, initial encounter: Secondary | ICD-10-CM | POA: Diagnosis not present

## 2020-05-14 DIAGNOSIS — S93505A Unspecified sprain of left lesser toe(s), initial encounter: Secondary | ICD-10-CM

## 2020-05-14 NOTE — ED Triage Notes (Signed)
Pt states she was dancing last night and caught her foot wrong and went down onto her knees. Left foot pain proximal to toes across top of foot

## 2020-05-14 NOTE — Discharge Instructions (Addendum)
SPRAIN: Stressed avoiding painful activities . Reviewed RICE guidelines. Use medications as directed, including NSAIDs. If no NSAIDs have been prescribed for you today, you may take Aleve or Motrin over the counter. May use Tylenol in between doses of NSAIDs.  If no improvement in the next 1-2 weeks, f/u with PCP or return to our office for reexamination, and please feel free to call or return at any time for any questions or concerns you may have and we will be happy to help you!     

## 2020-05-14 NOTE — ED Provider Notes (Signed)
[facility]    CSN: [id number] Arrival date & time: 05/14/20  1317      History   Chief Complaint Chief Complaint  Patient presents with  . Foot Injury    HPI Tammy Garcia is a 54 y.o. female presenting for left foot pain.  She says that she was dancing last night and fell onto the foot.  Patient has a swelling to the top of the foot at the bases of the second third and fourth digits.  Admits to bruising this area as well.  States that her can move her toes especially the second and third toes.  Admits to pain when walking.  Denies any numbness, weakness, tingling.  Denies history of any fractures of the foot.  Has taken over-the-counter Tylenol and Motrin for pain without relief.  Patient has no other concerns today.  HPI  Past Medical History:  Diagnosis Date  . 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 Hypertension   . Inner ear disease   . Motion sickness    boats  . Multilevel degenerative disc disease     Patient Active Problem List   Diagnosis Date Noted  . Encounter for screening colonoscopy   . Polyp of colon   . Pure hypercholesterolemia 11/30/2019  . Insomnia 11/30/2019  . Anxiety 11/30/2019  . Tobacco use 11/30/2019  . Onychomycosis with ingrown toenail 12/14/2018  . Perimenopausal vasomotor symptoms 12/14/2018  . Hypertensive disorder 10/08/2018    Past Surgical History:  Procedure Laterality Date  . COLONOSCOPY WITH PROPOFOL N/A 03/16/2020   Procedure: COLONOSCOPY WITH BIOPSY;  Surgeon: Lucilla Lame, MD;  Location: [facility];  Service: Endoscopy;  Laterality: N/A;  priority 4  . POLYPECTOMY N/A 03/16/2020   Procedure: POLYPECTOMY;  Surgeon: Lucilla Lame, MD;  Location: [facility];  Service: Endoscopy;  Laterality: N/A;  . TUBAL LIGATION      OB History   No obstetric history on file.      Home Medications    Prior to Admission medications   Medication Sig Start Date End Date Taking? Authorizing Provider  atenolol  (TENORMIN) 50 MG tablet Take 1 tablet (50 mg total) by mouth daily. 01/20/20   Delsa Grana, PA-C  cephALEXin (KEFLEX) 500 MG capsule Take 1 capsule (500 mg total) by mouth 2 (two) times daily. 03/08/20   Melynda Ripple, MD  chlorthalidone (HYGROTON) 25 MG tablet Take 1 tablet (25 mg total) by mouth daily. 01/20/20   Delsa Grana, PA-C  escitalopram (LEXAPRO) 10 MG tablet Take 1 tablet (10 mg total) by mouth daily. 01/20/20   Delsa Grana, PA-C  ibuprofen (ADVIL) 600 MG tablet Take 1 tablet (600 mg total) by mouth every 6 (six) hours as needed. 03/08/20   Melynda Ripple, MD  methylPREDNISolone (MEDROL DOSEPAK) 4 MG TBPK tablet Take by mouth daily. Follow package instructions 03/08/20   Melynda Ripple, MD  tiZANidine (ZANAFLEX) 4 MG tablet Take 1 tablet (4 mg total) by mouth every 8 (eight) hours as needed for muscle spasms. 03/08/20   Melynda Ripple, MD  traZODone (DESYREL) 50 MG tablet Take 0.5-1 tablets (25-50 mg total) by mouth at bedtime as needed for sleep. 01/20/20   Delsa Grana, PA-C  venlafaxine (EFFEXOR) 37.5 MG tablet Take 1 tablet once daily x7 days, then increase to 1 tablet twice daily 12/14/18 02/23/19  Hubbard Hartshorn, FNP    Family History Family History  Problem Relation Age of Onset  . Cirrhosis Father   . Hyperlipidemia Sister   .  Diabetes Maternal Aunt   . Kidney disease Maternal Aunt   . Hypertension Maternal Aunt   . Breast cancer Neg Hx     Social History Social History   Tobacco Use  . Smoking status: Current Every Day Smoker    Packs/day: 0.15    Years: 10.00    Pack years: 1.50    Types: Cigarettes  . Smokeless tobacco: Never Used  . Tobacco comment: 3 cigs/day  Vaping Use  . Vaping Use: Never used  Substance Use Topics  . Alcohol use: Yes    Alcohol/week: 7.0 standard drinks    Types: 7 Cans of beer per week  . Drug use: Yes    Types: Marijuana    Comment: last use a month ago     Allergies   Patient has no known allergies.   Review of  Systems Review of Systems  Constitutional: Negative for fatigue and fever.  Musculoskeletal: Positive for arthralgias, gait problem and joint swelling. Negative for myalgias.  Skin: Negative for color change, rash and wound.  Neurological: Negative for weakness and numbness.     Physical Exam Triage Vital Signs ED Triage Vitals  Enc Vitals Group     BP 05/14/20 1429 (!) 132/92     Pulse Rate 05/14/20 1429 79     Resp 05/14/20 1429 16     Temp 05/14/20 1429 98.6 F (37 C)     Temp Source 05/14/20 1429 Oral     SpO2 05/14/20 1429 100 %     Weight 05/14/20 1425 144 lb (65.3 kg)     Height 05/14/20 1425 5\' 2"  (1.575 m)     Head Circumference --      Peak Flow --      Pain Score 05/14/20 1425 10     Pain Loc --      Pain Edu? --      Excl. in [place]? --    No data found.  Updated Vital Signs BP (!) 132/92 (BP Location: Right Arm)   Pulse 79   Temp 98.6 F (37 C) (Oral)   Resp 16   Ht 5\' 2"  (1.575 m)   Wt 144 lb (65.3 kg)   LMP 02/02/2020   SpO2 100%   BMI 26.34 kg/m       Physical Exam Vitals and nursing note reviewed.  Constitutional:      General: She is not in acute distress.    Appearance: Normal appearance. She is not ill-appearing or toxic-appearing.  HENT:     Head: Normocephalic and atraumatic.  Eyes:     General: No scleral icterus.       Right eye: No discharge.        Left eye: No discharge.     Conjunctiva/sclera: Conjunctivae normal.  Cardiovascular:     Rate and Rhythm: Normal rate and regular rhythm.     Pulses: Normal pulses.  Pulmonary:     Effort: Pulmonary effort is normal. No respiratory distress.  Musculoskeletal:     Cervical back: Neck supple.     Left foot: Decreased range of motion. Normal capillary refill. Swelling (enitre third toe and base of third digit) and bony tenderness (entire third digit and base of 2nd and 3rd digits) present. Tenderness: decreased flexion of third toe due to pain. Normal pulse.  Skin:    General: Skin is  dry.  Neurological:     General: No focal deficit present.     Mental Status: She is alert. Mental status  is at baseline.     Motor: No weakness.     Gait: Gait abnormal.  Psychiatric:        Mood and Affect: Mood normal.        Behavior: Behavior normal.        Thought Content: Thought content normal.      UC Treatments / Results  Labs (all labs ordered are listed, but only abnormal results are displayed) Labs Reviewed - No data to display  EKG   Radiology DG Foot Complete Left  Result Date: 05/14/2020 CLINICAL DATA:  Left foot pain after injury last night. EXAM: LEFT FOOT - COMPLETE 3+ VIEW COMPARISON:  None. FINDINGS: There is no evidence of fracture or dislocation. There is no evidence of arthropathy or other focal bone abnormality. Soft tissues are unremarkable. IMPRESSION: Negative. Electronically Signed   By: Marijo Conception M.D.   On: 05/14/2020 14:41    Procedures Procedures (including critical care time)  Medications Ordered in UC Medications - No data to display  Initial Impression / Assessment and Plan / UC Course  I have reviewed the triage vital signs and the nursing notes.  Pertinent labs & imaging results that were available during my care of the patient were reviewed by me and considered in my medical decision making (see chart for details).   X-ray negative for fractures today.  Suspect sprain of third digit and contusion of the left foot.  Patient provided with crutches so that she does not to bear weight as long as painful.  Advised her to ice the digit and she can continue taking over-the-counter NSAIDs and Tylenol for pain relief.  Advised her to follow-up for repeat x-ray if no improvement in condition over the next week.  Follow-up with our clinic as needed or Ortho.  Patient agreeable.  Final Clinical Impressions(s) / UC Diagnoses   Final diagnoses:  Sprain of toe, third, left, initial encounter  Contusion of left foot, initial encounter      Discharge Instructions     SPRAIN: Stressed avoiding painful activities . Reviewed RICE guidelines. Use medications as directed, including NSAIDs. If no NSAIDs have been prescribed for you today, you may take Aleve or Motrin over the counter. May use Tylenol in between doses of NSAIDs.  If no improvement in the next 1-2 weeks, f/u with PCP or return to our office for reexamination, and please feel free to call or return at any time for any questions or concerns you may have and we will be happy to help you!        ED Prescriptions    None     PDMP not reviewed this encounter.   Danton Clap, PA-C 05/15/20 2032

## 2020-05-29 ENCOUNTER — Encounter: Payer: Self-pay | Admitting: Family Medicine

## 2020-06-02 ENCOUNTER — Telehealth: Payer: 59 | Admitting: Family Medicine

## 2020-06-05 ENCOUNTER — Telehealth (INDEPENDENT_AMBULATORY_CARE_PROVIDER_SITE_OTHER): Payer: 59 | Admitting: Family Medicine

## 2020-06-05 ENCOUNTER — Other Ambulatory Visit: Payer: Self-pay

## 2020-06-05 ENCOUNTER — Encounter: Payer: Self-pay | Admitting: Family Medicine

## 2020-06-05 DIAGNOSIS — F419 Anxiety disorder, unspecified: Secondary | ICD-10-CM

## 2020-06-05 DIAGNOSIS — F439 Reaction to severe stress, unspecified: Secondary | ICD-10-CM | POA: Diagnosis not present

## 2020-06-05 NOTE — Progress Notes (Signed)
Name: Tammy Garcia   MRN: [id number]    DOB: 1966-01-16   Date:06/05/2020       Progress Note  Subjective:    Chief Complaint  Chief Complaint  Patient presents with  . Anxiety   Virtual appt scheduled with pt for  1:20 PM EDT  Have tried to connect with pt via mychart video - sent text, attempted telephone call x 2 - tried to leave VM but mailbox was full Another two attempts after 2 pm- same - went to voice mail - mailbox full  Two more attempts - unsuccessful  4:35 PM - called pt back again - finally connected with her via telephone call encounter   I connected with  [name]  on 06/05/20 at  4:35 pm PM EDT by telephone and verified that I am speaking with the correct person using two identifiers.  I discussed the limitations of evaluation and management by telemedicine and the availability of in person appointments. The patient expressed understanding and agreed to proceed. Staff also discussed with the patient that there may be a patient responsible charge related to this service.  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 they have tried to put her in the dementia unit She wants a note for work stating she needs to get out of the unit she is currently working in   GAD 7 : Generalized Anxiety Score 06/05/2020 03/01/2019 12/14/2018  Nervous, Anxious, on Edge 3 2 1   Control/stop worrying 0 2 1  Worry too much - different things 0 2 1  Trouble relaxing 0 2 0  Restless 0 2 0  Easily annoyed or irritable 3 2 1     Afraid - awful might happen 0 2 1  Total GAD 7 Score 6 14 5   Anxiety Difficulty Not difficult at all Somewhat difficult Not difficult at all   Depression screen [facility] 2/9 06/05/2020 01/20/2020 11/30/2019  Decreased Interest 3 0 0  Down, Depressed, Hopeless 0 0 0  PHQ - 2 Score 3 0 0  Altered sleeping 1 1 0  Tired, decreased energy 3 0 0  Change in appetite 1 1 0  Feeling bad or failure about yourself  0 0 0  Trouble concentrating 0 0 0  Moving slowly or fidgety/restless 0 0 0  Suicidal thoughts 0 0 0  PHQ-9 Score 8 2 0  Difficult doing work/chores Not difficult at all Somewhat difficult Not difficult at all  Some recent data might be hidden      Patient Active Problem List   Diagnosis Date Noted  . Encounter for screening colonoscopy   . Polyp of colon   . Pure hypercholesterolemia 11/30/2019  . Insomnia 11/30/2019  . Anxiety 11/30/2019  . Tobacco use 11/30/2019  . Onychomycosis with ingrown toenail 12/14/2018  . Perimenopausal vasomotor symptoms 12/14/2018  . Hypertensive disorder 10/08/2018    Social History   Tobacco Use  . Smoking status: Current Every Day Smoker    Packs/day: 0.15  Years: 10.00    Pack years: 1.50    Types: Cigarettes  . Smokeless tobacco: Never Used  . Tobacco comment: 3 cigs/day  Substance Use Topics  . Alcohol use: Yes    Alcohol/week: 7.0 standard drinks    Types: 7 Cans of beer per week     Current Outpatient Medications:  .  atenolol (TENORMIN) 50 MG tablet, Take 1 tablet (50 mg total) by mouth daily., Disp: 90 tablet, Rfl: 3 .  cephALEXin (KEFLEX) 500 MG capsule, Take 1 capsule (500 mg total) by mouth 2 (two) times daily., Disp: 14 capsule, Rfl: 0 .  chlorthalidone (HYGROTON) 25 MG tablet, Take 1 tablet (25 mg total) by mouth daily., Disp: 90 tablet, Rfl: 3 .  escitalopram (LEXAPRO) 10 MG tablet, Take 1 tablet (10 mg total) by mouth daily., Disp: 90 tablet, Rfl: 3 .  ibuprofen (ADVIL) 600 MG tablet, Take 1 tablet (600 mg total) by  mouth every 6 (six) hours as needed., Disp: 30 tablet, Rfl: 0 .  methylPREDNISolone (MEDROL DOSEPAK) 4 MG TBPK tablet, Take by mouth daily. Follow package instructions, Disp: 21 tablet, Rfl: 0 .  tiZANidine (ZANAFLEX) 4 MG tablet, Take 1 tablet (4 mg total) by mouth every 8 (eight) hours as needed for muscle spasms., Disp: 30 tablet, Rfl: 0 .  traZODone (DESYREL) 50 MG tablet, Take 0.5-1 tablets (25-50 mg total) by mouth at bedtime as needed for sleep., Disp: 90 tablet, Rfl: 3  No Known Allergies  I personally reviewed active problem list, medication list, allergies, family history, social history, health maintenance, notes from last encounter, lab results, imaging with the patient/caregiver today.   Review of Systems  10 Systems reviewed and are negative for acute change except as noted in the HPI.   Objective:   Virtual encounter, vitals limited, only able to obtain the following There were no vitals filed for this visit. There is no height or weight on file to calculate BMI. Nursing Note and Vital Signs reviewed.  Physical Exam Vitals and nursing note reviewed.  Neurological:     Mental Status: She is alert.  Psychiatric:        Mood and Affect: Mood normal.        Behavior: Behavior normal.     PE limited by telephone encounter  No results found for this or any previous visit (from the past 72 hour(s)).  Assessment and Plan:     ICD-10-CM   1. Anxiety disorder, unspecified type  F41.9    recent worsening sx with change in job and work stress, continue lexapro, letter written to request accomodations for pt.    2. Situational stress  F43.9    supportive listening done today, continue lexapro, letter written, urged contacting HR if any conflict with request - may need psychiatry if specialists eval and recommendations are required related to her work restrictions.      - I discussed the assessment and treatment plan with the patient. The patient was provided an  opportunity to ask questions and all were answered. The patient agreed with the plan and demonstrated an understanding of the instructions.  I provided ~20 minutes of non-face-to-face time during this encounter.  Delsa Grana, PA-C 06/05/20 1:30 PM

## 2021-01-15 ENCOUNTER — Other Ambulatory Visit: Payer: Self-pay

## 2021-01-15 ENCOUNTER — Encounter: Payer: Self-pay | Admitting: Unknown Physician Specialty

## 2021-01-15 ENCOUNTER — Encounter: Payer: Self-pay | Admitting: Family Medicine

## 2021-01-15 ENCOUNTER — Ambulatory Visit (INDEPENDENT_AMBULATORY_CARE_PROVIDER_SITE_OTHER): Payer: Managed Care, Other (non HMO) | Admitting: Unknown Physician Specialty

## 2021-01-15 VITALS — BP 124/78 | HR 66 | Temp 98.3°F | Resp 16 | Ht 62.0 in | Wt 145.3 lb

## 2021-01-15 DIAGNOSIS — H1032 Unspecified acute conjunctivitis, left eye: Secondary | ICD-10-CM | POA: Diagnosis not present

## 2021-01-15 MED ORDER — ERYTHROMYCIN 5 MG/GM OP OINT
1.0000 | TOPICAL_OINTMENT | Freq: Two times a day (BID) | OPHTHALMIC | 0 refills | Status: DC
Start: 2021-01-15 — End: 2021-05-21

## 2021-01-15 MED ORDER — AZELASTINE HCL 0.05 % OP SOLN: 1.0000 [drp] | Freq: Two times a day (BID) | OPHTHALMIC | 12 refills | Status: DC

## 2021-01-15 NOTE — Progress Notes (Signed)
BP 124/78   Pulse 66   Temp 98.3 F (36.8 C)   Resp 16   Ht 5\' 2"  (1.575 m)   Wt 145 lb 4.8 oz (65.9 kg)   SpO2 98%   BMI 26.58 kg/m    Subjective:    Patient ID: Tammy Garcia, female    DOB: 10/14/1965, 55 y.o.   MRN: [id number]  HPI: Tammy Garcia is a 55 y.o. female  Chief Complaint  Patient presents with  . Conjunctivitis    Left, painful, feels like something is in it   Conjunctivitis  The current episode started yesterday. The onset was sudden. The problem has been gradually worsening. Nothing relieves the symptoms. Associated symptoms include eye itching, photophobia, eye discharge, eye pain and eye redness. Pertinent negatives include no orthopnea, no fever, no decreased vision, no double vision, no abdominal pain, no constipation, no diarrhea, no nausea, no vomiting, no congestion, no ear discharge, no ear pain, no headaches, no hearing loss, no mouth sores, no rhinorrhea, no sore throat, no stridor, no swollen glands, no cough, no URI and no wheezing.  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 Interim medical history since our last visit reviewed. Allergies and medications reviewed and updated.  Review of Systems  Constitutional: Negative for fever.  HENT: Negative for congestion, ear discharge, ear pain, hearing loss, mouth sores, rhinorrhea and sore throat.   Eyes: Positive for photophobia, pain, discharge, redness and itching. Negative for double vision.  Respiratory: Negative for cough, wheezing and stridor.   Cardiovascular: Negative for orthopnea.  Gastrointestinal: Negative for abdominal pain, constipation, diarrhea, nausea and vomiting.  Neurological: Negative for headaches.    Per HPI unless specifically indicated above     Objective:    BP 124/78   Pulse 66   Temp 98.3 F (36.8 C)   Resp 16   Ht 5\' 2"  (1.575 m)   Wt 145 lb 4.8 oz (65.9 kg)   SpO2  98%   BMI 26.58 kg/m   Wt Readings from Last 3 Encounters:  01/15/21 145 lb 4.8 oz (65.9 kg)  05/14/20 144 lb (65.3 kg)  03/16/20 142 lb (64.4 kg)    Physical Exam Constitutional:      General: She is not in acute distress.    Appearance: Normal appearance. She is well-developed.  HENT:     Head: Normocephalic and atraumatic.  Eyes:     General: Lids are normal. No scleral icterus.       Right eye: No discharge.        Left eye: Discharge present.    Extraocular Movements: Extraocular movements intact.     Conjunctiva/sclera:     Left eye: Left conjunctiva is injected. No exudate.    Comments: Discharge is clear  Cardiovascular:     Rate and Rhythm: Normal rate.  Pulmonary:     Effort: Pulmonary effort is normal.  Abdominal:     Palpations: There is no hepatomegaly or splenomegaly.  Musculoskeletal:        General: Normal range of motion.  Skin:    Coloration: Skin is not pale.     Findings: No rash.  Neurological:     Mental Status: She is alert and oriented to person, place, and time.  Psychiatric:        Behavior: Behavior normal.        Thought Content: Thought content normal.  Judgment: Judgment normal.     Results for orders placed or performed in visit on 03/16/20  Pathology  Result Value Ref Range   . Comment    . Comment    . Comment    . Comment:    . Comment    . Comment    . Comment    . Comment       Assessment & Plan:   Problem List Items Addressed This Visit   None   Visit Diagnoses    Acute conjunctivitis of left eye, unspecified acute conjunctivitis type    -  Primary   New problem.  Allergic vs viral.  Will start Optivar BID followed by opth oint to help the feeling of grittiness.         Follow up plan: To [facility] if no improvement or decrease in vision

## 2021-01-17 ENCOUNTER — Telehealth: Payer: Self-pay | Admitting: Emergency Medicine

## 2021-01-17 NOTE — Telephone Encounter (Signed)
Patient called and stated she was having increase pain. Eye still swollen and red. It is now hard for her to see and go outside. Patient stated she was told that she could have a referral if not any better today. Referral made to [facility].

## 2021-01-18 NOTE — Telephone Encounter (Signed)
Patient was seen at [facility] on 6/8

## 2021-01-26 ENCOUNTER — Other Ambulatory Visit: Payer: Self-pay | Admitting: Family Medicine

## 2021-01-26 DIAGNOSIS — G4700 Insomnia, unspecified: Secondary | ICD-10-CM

## 2021-01-26 DIAGNOSIS — I10 Essential (primary) hypertension: Secondary | ICD-10-CM

## 2021-01-26 DIAGNOSIS — N951 Menopausal and female climacteric states: Secondary | ICD-10-CM

## 2021-01-26 DIAGNOSIS — F419 Anxiety disorder, unspecified: Secondary | ICD-10-CM

## 2021-01-26 MED ORDER — CHLORTHALIDONE 25 MG PO TABS: 25.0000 mg | ORAL_TABLET | Freq: Every day | ORAL | 0 refills | Status: DC

## 2021-01-26 MED ORDER — TRAZODONE HCL 50 MG PO TABS: 25.0000 mg | ORAL_TABLET | Freq: Every evening | ORAL | 0 refills | Status: DC | PRN

## 2021-01-26 NOTE — Telephone Encounter (Signed)
Medication Refill - Medication: traZODone (DESYREL) 50 MG tablet and chlorthalidone (HYGROTON) 25 MG tablet  Has the patient contacted their pharmacy? yes (Agent: If no, request that the patient contact the pharmacy for the refill.) (Agent: If yes, when and what did the pharmacy advise?)contact pcp  Preferred [place], [place] - [place] Phone:  [phone]  Fax:  [phone]      Agent: Please be advised that RX refills may take up to 3 business days. We ask that you follow-up with your pharmacy.

## 2021-01-26 NOTE — Telephone Encounter (Signed)
Patient called in for refill of atenolol (TENORMIN) 50 MG tablet . She is completely out. Her appt is scheduled for 07/12. Please call back if patient can get short supply.

## 2021-01-26 NOTE — Telephone Encounter (Signed)
Patient is also out of traZODone (DESYREL) 50 MG tablet  and chlorthalidone (HYGROTON) 25 MG tablet . Please give short supply if possible, until apt on 07/12

## 2021-01-30 ENCOUNTER — Other Ambulatory Visit: Payer: Self-pay | Admitting: Family Medicine

## 2021-01-30 DIAGNOSIS — F419 Anxiety disorder, unspecified: Secondary | ICD-10-CM

## 2021-01-30 DIAGNOSIS — G4700 Insomnia, unspecified: Secondary | ICD-10-CM

## 2021-01-30 DIAGNOSIS — N951 Menopausal and female climacteric states: Secondary | ICD-10-CM

## 2021-02-20 ENCOUNTER — Ambulatory Visit: Payer: Managed Care, Other (non HMO) | Admitting: Family Medicine

## 2021-02-21 IMAGING — CR DG LUMBAR SPINE COMPLETE 4+V
5 series · 6 of 6 positions shown · non-contrast
Comparison: None.

CLINICAL DATA: Low back pain

EXAM:
LUMBAR SPINE - COMPLETE 4+ VIEW

[l-spine ap]
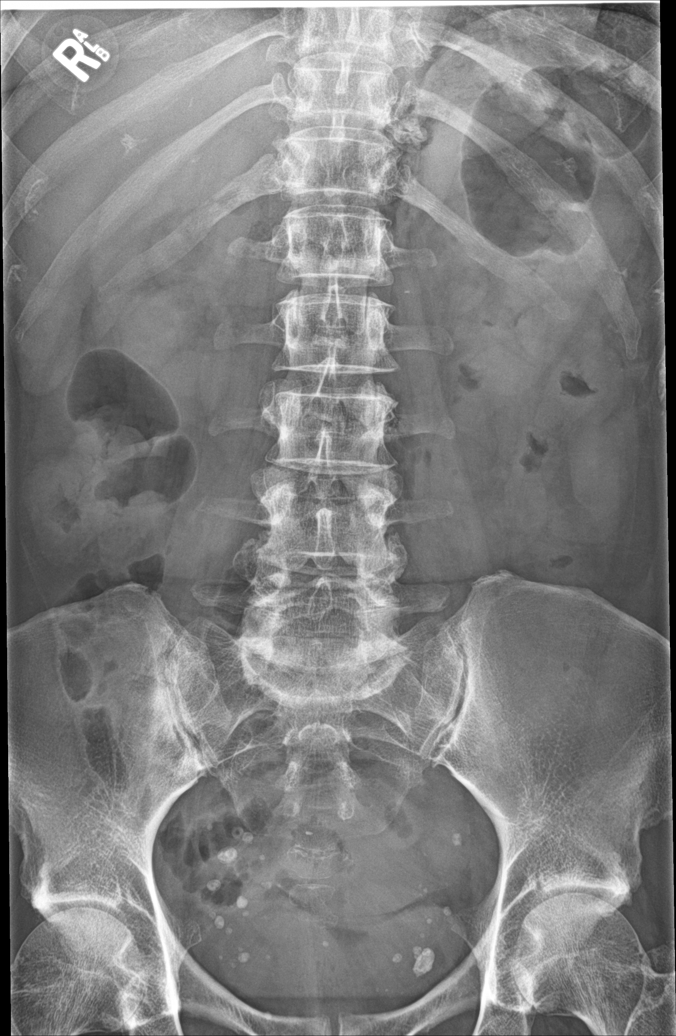

[Series 2: l-spine obl · 0.14mm/px · 2 of 2 slices shown (1 of 2)]
[im 1/2]
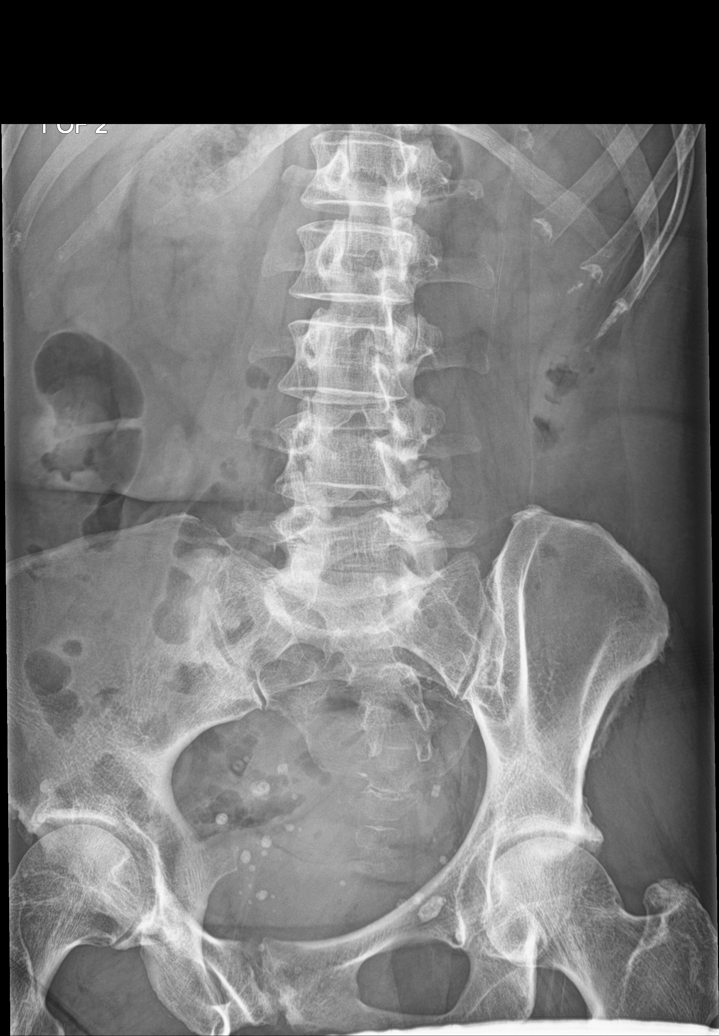
[im 2/2]
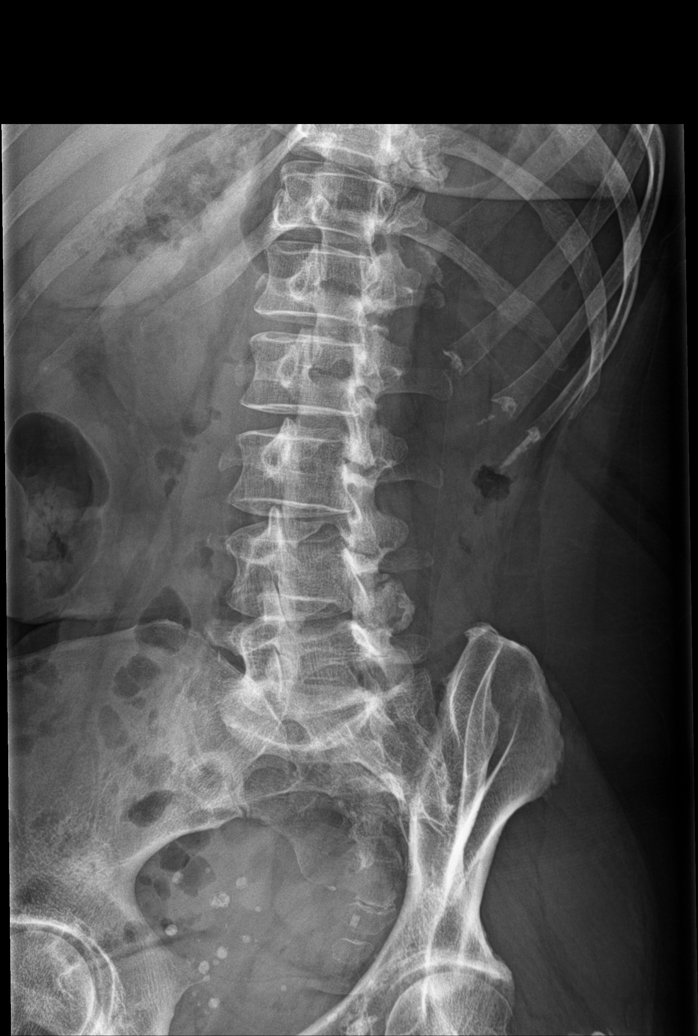

[l-spine obl (2 of 2)]
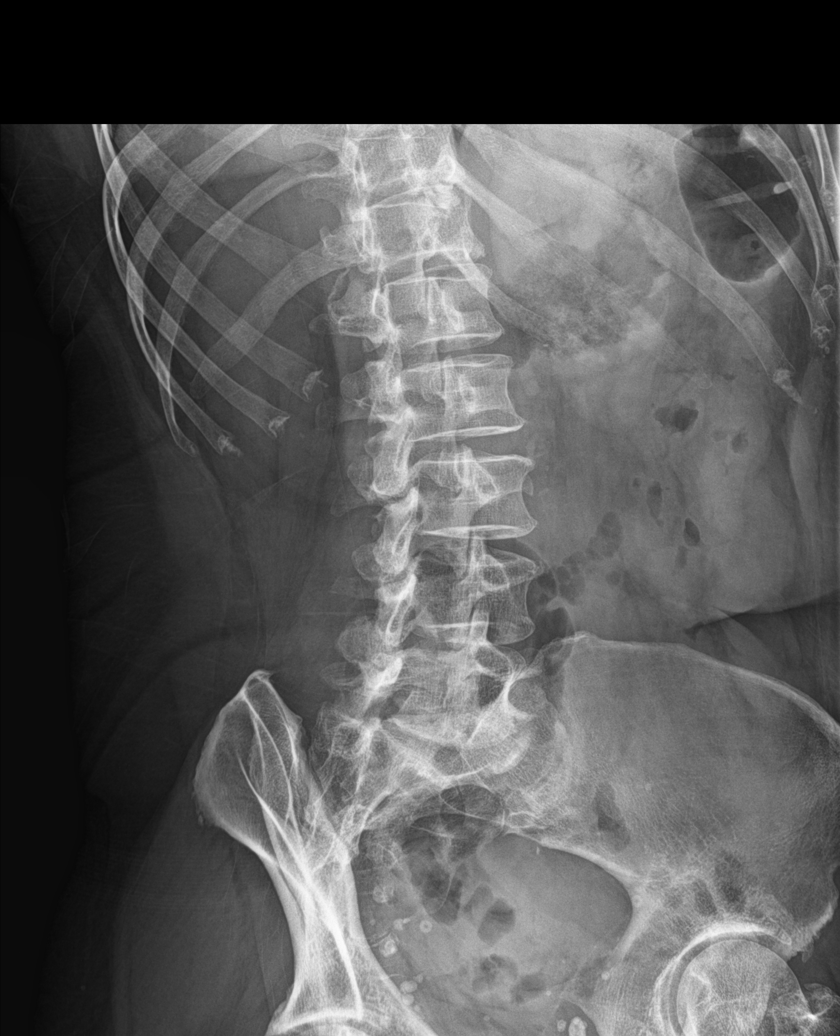

[l-spine lat]
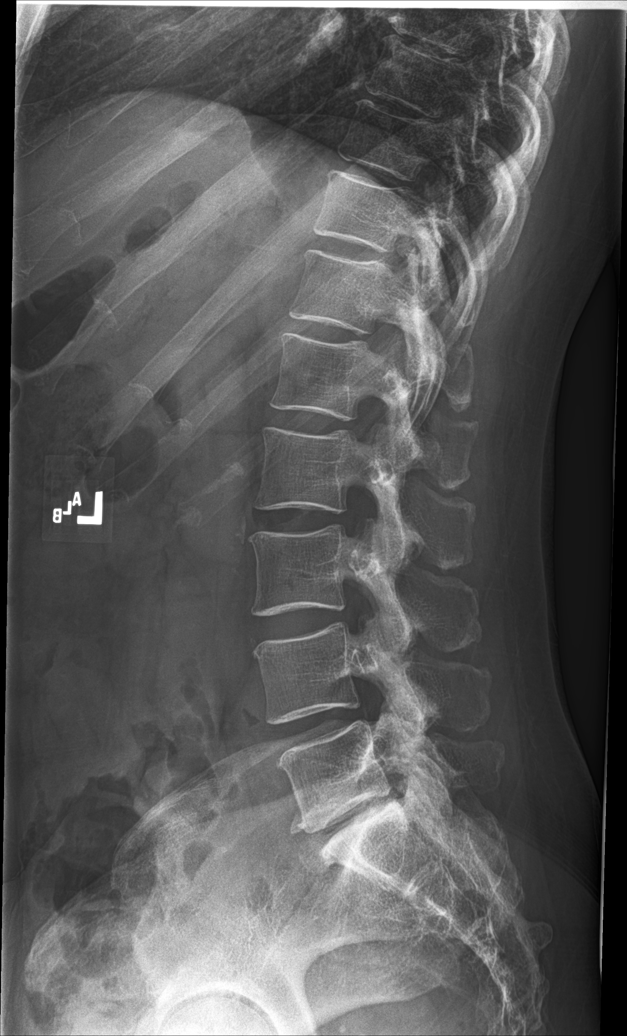

[l-spine spot]
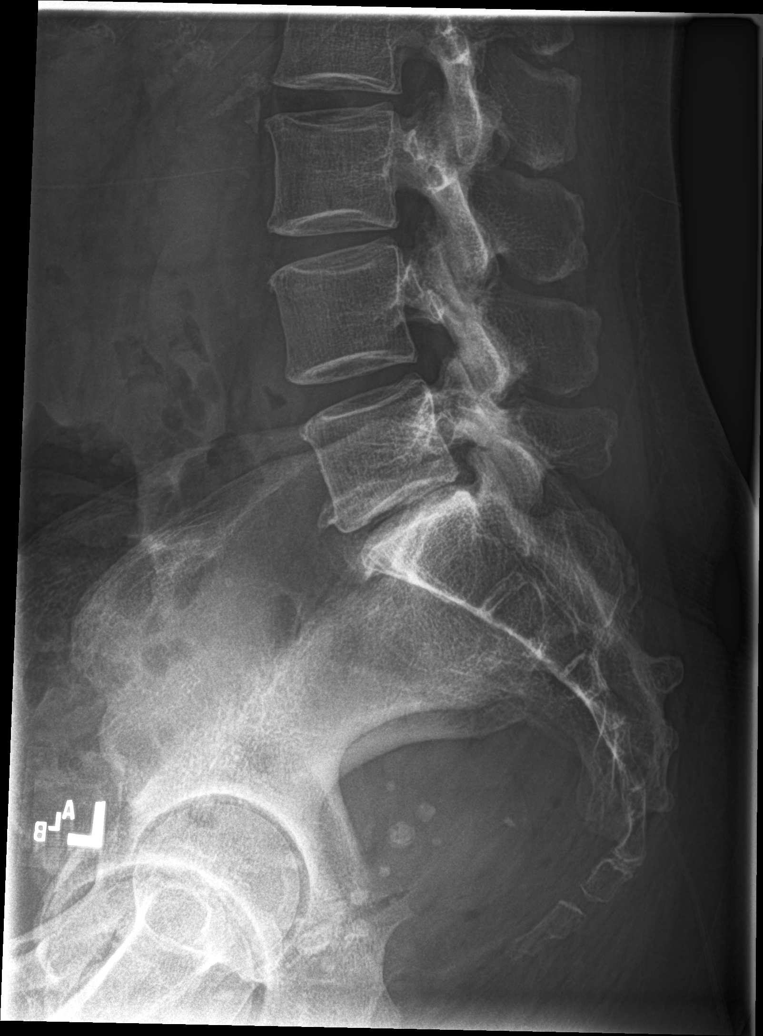

[6 of 6 positions shown; findings below may reference images not displayed]

FINDINGS: Frontal, lateral, spot lumbosacral lateral, and bilateral oblique
views were obtained. There are 5 non-rib-bearing lumbar type
vertebral bodies. There is no fracture or spondylolisthesis. There
is moderate disc space narrowing at L5-S1. Other disc spaces appear
unremarkable. There is facet osteoarthritic change at L4-5 and L5-S1
bilaterally. Facets at other levels appear unremarkable. There is
sclerosis in the right sacroiliac joint region. The left sacroiliac
joint appears normal.
IMPRESSION: Moderate disc space narrowing at L5-S1. Other disc spaces appear
unremarkable. Facet arthropathy noted at L4-5 and L5-S1 bilaterally.

No fracture or spondylolisthesis.

There is mild osteitis condensans [place] on the right.

## 2021-03-14 ENCOUNTER — Other Ambulatory Visit: Payer: Self-pay | Admitting: Family Medicine

## 2021-03-14 DIAGNOSIS — G4700 Insomnia, unspecified: Secondary | ICD-10-CM

## 2021-03-14 DIAGNOSIS — F419 Anxiety disorder, unspecified: Secondary | ICD-10-CM

## 2021-03-14 DIAGNOSIS — I10 Essential (primary) hypertension: Secondary | ICD-10-CM

## 2021-03-28 ENCOUNTER — Other Ambulatory Visit: Payer: Self-pay | Admitting: Family Medicine

## 2021-03-28 DIAGNOSIS — Z1231 Encounter for screening mammogram for malignant neoplasm of breast: Secondary | ICD-10-CM

## 2021-04-09 ENCOUNTER — Ambulatory Visit
Admission: RE | Admit: 2021-04-09 | Discharge: 2021-04-09 | Disposition: A | Discharge status: Home / Self Care | Payer: Managed Care, Other (non HMO) | Source: Ambulatory Visit | Attending: Family Medicine | Admitting: Family Medicine

## 2021-04-09 ENCOUNTER — Other Ambulatory Visit: Payer: Self-pay

## 2021-04-09 DIAGNOSIS — Z1231 Encounter for screening mammogram for malignant neoplasm of breast: Secondary | ICD-10-CM | POA: Diagnosis present

## 2021-04-13 ENCOUNTER — Encounter: Payer: Self-pay | Admitting: Family Medicine

## 2021-04-17 ENCOUNTER — Other Ambulatory Visit: Payer: Self-pay | Admitting: Family Medicine

## 2021-04-17 ENCOUNTER — Telehealth: Payer: Self-pay | Admitting: Family Medicine

## 2021-04-17 DIAGNOSIS — R921 Mammographic calcification found on diagnostic imaging of breast: Secondary | ICD-10-CM

## 2021-04-17 DIAGNOSIS — R928 Other abnormal and inconclusive findings on diagnostic imaging of breast: Secondary | ICD-10-CM

## 2021-04-17 NOTE — Telephone Encounter (Signed)
Leisa please sign off on order

## 2021-04-17 NOTE — Telephone Encounter (Signed)
Pt is calling back stating that her mammogram results have been signed off by Leisa on 04/13/21 and is needing clarification. This is patients 2nd request for mammogram results. Please advise CB- [phone]

## 2021-04-17 NOTE — Telephone Encounter (Signed)
Spoke to patient regarding next step for US/mammogram. Patient was informed I would call to hospital to see what is need for her next step.

## 2021-04-19 ENCOUNTER — Other Ambulatory Visit: Payer: Self-pay | Admitting: Nurse Practitioner

## 2021-04-19 DIAGNOSIS — R921 Mammographic calcification found on diagnostic imaging of breast: Secondary | ICD-10-CM

## 2021-04-19 DIAGNOSIS — R928 Other abnormal and inconclusive findings on diagnostic imaging of breast: Secondary | ICD-10-CM

## 2021-04-19 NOTE — Telephone Encounter (Signed)
Almyra Free signed order, pt notified.

## 2021-04-20 ENCOUNTER — Ambulatory Visit
Admission: RE | Admit: 2021-04-20 | Discharge: 2021-04-20 | Disposition: A | Discharge status: Home / Self Care | Payer: Managed Care, Other (non HMO) | Source: Ambulatory Visit | Attending: Nurse Practitioner | Admitting: Nurse Practitioner

## 2021-04-20 ENCOUNTER — Other Ambulatory Visit: Payer: Self-pay

## 2021-04-20 DIAGNOSIS — R921 Mammographic calcification found on diagnostic imaging of breast: Secondary | ICD-10-CM | POA: Insufficient documentation

## 2021-04-20 DIAGNOSIS — R928 Other abnormal and inconclusive findings on diagnostic imaging of breast: Secondary | ICD-10-CM | POA: Diagnosis present

## 2021-05-03 ENCOUNTER — Encounter: Payer: Managed Care, Other (non HMO) | Admitting: Unknown Physician Specialty

## 2021-05-21 ENCOUNTER — Encounter: Payer: Self-pay | Admitting: Family Medicine

## 2021-05-21 ENCOUNTER — Other Ambulatory Visit: Payer: Self-pay

## 2021-05-21 ENCOUNTER — Other Ambulatory Visit (HOSPITAL_COMMUNITY)
Admission: RE | Admit: 2021-05-21 | Discharge: 2021-05-21 | Disposition: A | Discharge status: Home / Self Care | Payer: Managed Care, Other (non HMO) | Source: Ambulatory Visit | Attending: Unknown Physician Specialty | Admitting: Unknown Physician Specialty

## 2021-05-21 ENCOUNTER — Ambulatory Visit (INDEPENDENT_AMBULATORY_CARE_PROVIDER_SITE_OTHER): Payer: Managed Care, Other (non HMO) | Admitting: Family Medicine

## 2021-05-21 VITALS — BP 120/82 | HR 82 | Temp 97.9°F | Resp 16 | Ht 62.0 in | Wt 147.3 lb

## 2021-05-21 DIAGNOSIS — Z23 Encounter for immunization: Secondary | ICD-10-CM | POA: Diagnosis not present

## 2021-05-21 DIAGNOSIS — G4700 Insomnia, unspecified: Secondary | ICD-10-CM

## 2021-05-21 DIAGNOSIS — F419 Anxiety disorder, unspecified: Secondary | ICD-10-CM

## 2021-05-21 DIAGNOSIS — I10 Essential (primary) hypertension: Secondary | ICD-10-CM | POA: Diagnosis not present

## 2021-05-21 DIAGNOSIS — N951 Menopausal and female climacteric states: Secondary | ICD-10-CM

## 2021-05-21 DIAGNOSIS — Z720 Tobacco use: Secondary | ICD-10-CM

## 2021-05-21 DIAGNOSIS — Z780 Asymptomatic menopausal state: Secondary | ICD-10-CM

## 2021-05-21 DIAGNOSIS — Z0000 Encounter for general adult medical examination without abnormal findings: Secondary | ICD-10-CM

## 2021-05-21 DIAGNOSIS — Z8619 Personal history of other infectious and parasitic diseases: Secondary | ICD-10-CM

## 2021-05-21 DIAGNOSIS — E7800 Pure hypercholesterolemia, unspecified: Secondary | ICD-10-CM

## 2021-05-21 DIAGNOSIS — Z113 Encounter for screening for infections with a predominantly sexual mode of transmission: Secondary | ICD-10-CM | POA: Insufficient documentation

## 2021-05-21 DIAGNOSIS — Z1159 Encounter for screening for other viral diseases: Secondary | ICD-10-CM

## 2021-05-21 DIAGNOSIS — Z833 Family history of diabetes mellitus: Secondary | ICD-10-CM

## 2021-05-21 MED ORDER — ATENOLOL 50 MG PO TABS: 50.0000 mg | ORAL_TABLET | Freq: Every day | ORAL | 3 refills | Status: DC

## 2021-05-21 MED ORDER — CHLORTHALIDONE 25 MG PO TABS: 25.0000 mg | ORAL_TABLET | Freq: Every day | ORAL | 3 refills | Status: DC

## 2021-05-21 MED ORDER — ESCITALOPRAM OXALATE 20 MG PO TABS: 20.0000 mg | ORAL_TABLET | Freq: Every day | ORAL | 3 refills | Status: DC

## 2021-05-21 NOTE — Assessment & Plan Note (Signed)
Symptoms better controlled with prn increase in lexapro, about a few times per week. Will increase to 20mg  daily.

## 2021-05-21 NOTE — Assessment & Plan Note (Signed)
Recheck labs 

## 2021-05-21 NOTE — Assessment & Plan Note (Signed)
Recommend trial of black cohosh

## 2021-05-21 NOTE — Progress Notes (Signed)
BP 120/82   Pulse 82   Temp 97.9 F (36.6 C) (Oral)   Resp 16   Ht 5\' 2"  (1.575 m)   Wt 147 lb 4.8 oz (66.8 kg)   SpO2 96%   BMI 26.94 kg/m    Subjective:    Patient ID: Tammy Garcia, female    DOB: 1965-10-31, 55 y.o.   MRN: [id number]  HPI: Tammy Garcia is a 55 y.o. female presenting on 05/21/2021 for comprehensive medical examination. Current medical complaints include:none  Hypertension: - Medications: atenolol, chlorthalidone - Compliance: good - Checking BP at home: yes, 130s SBP - Denies any LE edema, medication SEs, or symptoms of hypotension  Tobacco use - stopped smoking 01/28/21. 20 years, about 1/4ppd.  Anxiety - Medications: lexapro - Taking: sometimes taking 2 pills of lexapro - Counseling: no - Previous hospitalizations: no - Symptoms: excessive worrying - Current stressors: MIL recently passed away, aunt in hospital. - Coping Mechanisms: has dog - still taking trazodone, sometimes wakes feeling groggy  GAD 7 : Generalized Anxiety Score 06/05/2020 03/01/2019 12/14/2018  Nervous, Anxious, on Edge 3 2 1   Control/stop worrying 0 2 1  Worry too much - different things 0 2 1  Trouble relaxing 0 2 0  Restless 0 2 0  Easily annoyed or irritable 3 2 1   Afraid - awful might happen 0 2 1  Total GAD 7 Score 6 14 5   Anxiety Difficulty Not difficult at all Somewhat difficult Not difficult at all    She currently lives with: daughter, granddaughter Menopausal Symptoms: yes  Depression Screen done today and results listed below:  Depression screen [facility] 2/9 05/21/2021 01/15/2021 06/05/2020 01/20/2020 11/30/2019  Decreased Interest 0 0 3 0 0  Down, Depressed, Hopeless 0 0 0 0 0  PHQ - 2 Score 0 0 3 0 0  Altered sleeping 0 0 1 1 0  Tired, decreased energy 0 0 3 0 0  Change in appetite 0 0 1 1 0  Feeling bad or failure about yourself  0 0 0 0 0  Trouble concentrating 0 0 0 0 0  Moving slowly or fidgety/restless 0 0 0 0 0  Suicidal thoughts 0 0 0 0 0  PHQ-9 Score  0 0 8 2 0  Difficult doing work/chores Not difficult at all Not difficult at all Not difficult at all Somewhat difficult Not difficult at all  Some recent data might be hidden     Past Medical History:  Past Medical History:  Diagnosis Date   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   Hypertension    Inner ear disease    Motion sickness    boats   Multilevel degenerative disc disease     Surgical History:  Past Surgical History:  Procedure Laterality Date   COLONOSCOPY WITH PROPOFOL N/A 03/16/2020   Procedure: COLONOSCOPY WITH BIOPSY;  Surgeon: Lucilla Lame, MD;  Location: [place];  Service: Endoscopy;  Laterality: N/A;  priority 4   POLYPECTOMY N/A 03/16/2020   Procedure: POLYPECTOMY;  Surgeon: Lucilla Lame, MD;  Location: [place];  Service: Endoscopy;  Laterality: N/A;   TUBAL LIGATION      Medications:  Current Outpatient Medications on File Prior to Visit  Medication Sig   traZODone (DESYREL) 50 MG tablet TAKE 1/2 TO 1 (ONE-HALF TO ONE) TABLET BY MOUTH AT BEDTIME AS NEEDED FOR SLEEP   [DISCONTINUED] venlafaxine (EFFEXOR) 37.5 MG tablet Take 1 tablet once daily x7 days, then increase to 1 tablet  twice daily   No current facility-administered medications on file prior to visit.    Allergies:  No Known Allergies  Social History:  Social History   Socioeconomic History   Marital status: Significant Other    Spouse name: Not on file   Number of children: 3   Years of education: 12   Highest education level: Not on file  Occupational History   Not on file  Tobacco Use   Smoking status: Every Day    Packs/day: 0.15    Years: 10.00    Pack years: 1.50    Types: Cigarettes   Smokeless tobacco: Never   Tobacco comments:    3 cigs/day  Vaping Use   Vaping Use: Never used  Substance and Sexual Activity   Alcohol use: Yes    Alcohol/week: 7.0 standard drinks    Types: 7 Cans of beer per week   Drug use: Yes    Types: Marijuana    Comment: last  use a month ago   Sexual activity: Yes  Other Topics Concern   Not on file  Social History Narrative   Not on file   Social Determinants of Health   Financial Resource Strain: Low Risk    Difficulty of Paying Living Expenses: Not hard at all  Food Insecurity: No Food Insecurity   Worried About Charity fundraiser in the Last Year: Never true   Ran Out of Food in the Last Year: Never true  Transportation Needs: No Transportation Needs   Lack of Transportation (Medical): No   Lack of Transportation (Non-Medical): No  Physical Activity: Insufficiently Active   Days of Exercise per Week: 7 days   Minutes of Exercise per Session: 10 min  Stress: No Stress Concern Present   Feeling of Stress : Only a little  Social Connections: Socially Isolated   Frequency of Communication with Friends and Family: More than three times a week   Frequency of Social Gatherings with Friends and Family: Twice a week   Attends Religious Services: Never   Marine scientist or Organizations: No   Attends Music therapist: Patient refused   Marital Status: Separated  Human resources officer Violence: Not At Risk   Fear of Current or Ex-Partner: No   Emotionally Abused: No   Physically Abused: No   Sexually Abused: No   Social History   Tobacco Use  Smoking Status Every Day   Packs/day: 0.15   Years: 10.00   Pack years: 1.50   Types: Cigarettes  Smokeless Tobacco Never  Tobacco Comments   3 cigs/day   Social History   Substance and Sexual Activity  Alcohol Use Yes   Alcohol/week: 7.0 standard drinks   Types: 7 Cans of beer per week    Family History:  Family History  Problem Relation Age of Onset   Cirrhosis Father    Hyperlipidemia Sister    Diabetes Maternal Aunt    Kidney disease Maternal Aunt    Hypertension Maternal Aunt    Breast cancer Neg Hx     Past medical history, surgical history, medications, allergies, family history and social history reviewed with patient  today and changes made to appropriate areas of the chart.      Objective:    BP 120/82   Pulse 82   Temp 97.9 F (36.6 C) (Oral)   Resp 16   Ht 5\' 2"  (1.575 m)   Wt 147 lb 4.8 oz (66.8 kg)   SpO2 96%  BMI 26.94 kg/m   Wt Readings from Last 3 Encounters:  05/21/21 147 lb 4.8 oz (66.8 kg)  01/15/21 145 lb 4.8 oz (65.9 kg)  05/14/20 144 lb (65.3 kg)    Physical Exam Vitals reviewed.  Constitutional:      Appearance: Normal appearance.  HENT:     Head: Normocephalic.     Right Ear: External ear normal.     Left Ear: External ear normal.  Eyes:     Extraocular Movements: Extraocular movements intact.  Cardiovascular:     Rate and Rhythm: Normal rate and regular rhythm.     Heart sounds: Normal heart sounds. No murmur heard. Pulmonary:     Effort: Pulmonary effort is normal. No respiratory distress.     Breath sounds: Normal breath sounds.  Abdominal:     General: Bowel sounds are normal.     Palpations: Abdomen is soft.     Tenderness: There is no abdominal tenderness.  Musculoskeletal:        General: Normal range of motion.     Right lower leg: No edema.     Left lower leg: No edema.  Skin:    General: Skin is warm and dry.  Neurological:     Mental Status: She is alert and oriented to person, place, and time. Mental status is at baseline.  Psychiatric:        Mood and Affect: Mood normal.        Behavior: Behavior normal.    Results for orders placed or performed in visit on 03/16/20  Pathology  Result Value Ref Range   . Comment    . Comment    . Comment    . Comment:    . Comment    . Comment    . Comment    . Comment       Assessment & Plan:   Problem List Items Addressed This Visit       Cardiovascular and Mediastinum   Hypertensive disorder - Primary    Doing well on current regimen, no changes made today. Obtaining labs. Refills sent.      Relevant Medications   atenolol (TENORMIN) 50 MG tablet   chlorthalidone (HYGROTON) 25 MG tablet    Other Relevant Orders   Basic Metabolic Panel (BMET)   Lipid panel   Perimenopausal vasomotor symptoms    Recommend trial of black cohosh      Relevant Medications   atenolol (TENORMIN) 50 MG tablet   chlorthalidone (HYGROTON) 25 MG tablet     Other   Pure hypercholesterolemia    Recheck labs      Relevant Medications   atenolol (TENORMIN) 50 MG tablet   chlorthalidone (HYGROTON) 25 MG tablet   Insomnia   Relevant Medications   escitalopram (LEXAPRO) 20 MG tablet   Anxiety    Symptoms better controlled with prn increase in lexapro, about a few times per week. Will increase to 20mg  daily.       Relevant Medications   escitalopram (LEXAPRO) 20 MG tablet   Tobacco use    Quit in June! Congratulated efforts. Will continue to assess ongoing cessation and offer support if needed.       Other Visit Diagnoses     History of trichomoniasis       Relevant Orders   Cervicovaginal ancillary only   Need for hepatitis C screening test       Relevant Orders   Hepatitis C antibody   Perimenopausal  Relevant Medications   escitalopram (LEXAPRO) 20 MG tablet   Essential hypertension       stable well controlled   Relevant Medications   atenolol (TENORMIN) 50 MG tablet   chlorthalidone (HYGROTON) 25 MG tablet   Family history of diabetes mellitus       Relevant Orders   Hemoglobin A1c   Postmenopausal       Relevant Orders   DG Bone Density   Need for influenza vaccination       Relevant Orders   Flu Vaccine QUAD 6+ mos PF IM (Fluarix Quad PF) (Completed)        Follow up plan: Return in about 6 months (around 11/19/2021).   LABORATORY TESTING:  - Pap smear: pap done 2021, negative. +trich.   IMMUNIZATIONS:   - Tdap: Tetanus vaccination status reviewed: refused. - Influenza: Administered today - Pneumococcal: Not applicable - HPV: Not applicable - COVID vaccine: has had 3 doses of mRNA vaccine  SCREENING: -Mammogram: Up to date  - Colonoscopy: Up to  date  - Bone Density: Ordered today  - Lung Cancer Screening: Not applicable   Hep C Screening: due, ordered STD testing and prevention (HIV/chl/gon/syphilis): UTD, test of cure for prior trich done today Sexual History: monogamous Menstrual History/LMP/Abnormal Bleeding: post-menopausal Incontinence Symptoms: none  Osteoporosis: Discussed high calcium and vitamin D supplementation, weight bearing exercises  Advanced Care Planning: A voluntary discussion about advance care planning including the explanation and discussion of advance directives.  Discussed health care proxy and Living will, and the patient was able to identify a health care proxy as sister, Jefferson Fuel.  Patient does not have a living will at present time. If patient does have living will, I have requested they bring this to the clinic to be scanned in to their chart.  PATIENT COUNSELING:   Advised to take 1 mg of folate supplement per day if capable of pregnancy.   Sexuality: Discussed sexually transmitted diseases, partner selection, use of condoms, avoidance of unintended pregnancy  and contraceptive alternatives.   Advised to avoid cigarette smoking.  I discussed with the patient that most people either abstain from alcohol or drink within safe limits (<=14/week and <=4 drinks/occasion for males, <=7/weeks and <= 3 drinks/occasion for females) and that the risk for alcohol disorders and other health effects rises proportionally with the number of drinks per week and how often a drinker exceeds daily limits.  Discussed cessation/primary prevention of drug use and availability of treatment for abuse.   Diet: Encouraged to adjust caloric intake to maintain  or achieve ideal body weight, to reduce intake of dietary saturated fat and total fat, to limit sodium intake by avoiding high sodium foods and not adding table salt, and to maintain adequate dietary potassium and calcium preferably from fresh fruits, vegetables, and  low-fat dairy products.    stressed the importance of regular exercise  Injury prevention: Discussed safety belts, safety helmets, smoke detector, smoking near bedding or upholstery.   Dental health: Discussed importance of regular tooth brushing, flossing, and dental visits.    NEXT PREVENTATIVE PHYSICAL DUE IN 1 YEAR. Return in about 6 months (around 11/19/2021).

## 2021-05-21 NOTE — Patient Instructions (Signed)
It was great to see you! Congratulations on stopping smoking!!!   Our plans for today:  - We are increasing your lexapro to 20mg . We sent a new prescription to the pharmacy.  - No other changes to your medications.  - Come back in 6 months.  We are checking some labs today, we will release these results to your MyChart.  Take care and seek immediate care sooner if you develop any concerns.   Dr. Ky Barban  Things to do to keep yourself healthy  - Exercise at least 30-45 minutes a day, 3-4 days a week.  - Eat a low-fat diet with lots of fruits and vegetables, up to 7-9 servings per day.  - Seatbelts can save your life. Wear them always.  - Smoke detectors on every level of your home, check batteries every year.  - Eye Doctor - have an eye exam every 1-2 years  - Safe sex - if you may be exposed to STDs, use a condom.  - Alcohol -  If you drink, do it moderately, less than 2 drinks per day.  - [place]. Choose someone to speak for you if you are not able. https://www.prepareforyourcare.org is a great website to help you navigate this. - Depression is common in our stressful world.If you're feeling down or losing interest in things you normally enjoy, please come in for a visit.  - Violence - If anyone is threatening or hurting you, please call immediately.

## 2021-05-21 NOTE — Assessment & Plan Note (Signed)
Doing well on current regimen, no changes made today. Obtaining labs. Refills sent.

## 2021-05-21 NOTE — Assessment & Plan Note (Signed)
Quit in June! Congratulated efforts. Will continue to assess ongoing cessation and offer support if needed.

## 2021-05-22 LAB — CERVICOVAGINAL ANCILLARY ONLY
Bacterial Vaginitis (gardnerella): NEGATIVE
Candida Glabrata: NEGATIVE
Candida Vaginitis: NEGATIVE
Chlamydia: NEGATIVE
Comment: NEGATIVE
Comment: NEGATIVE
Comment: NEGATIVE
Comment: NEGATIVE
Comment: NEGATIVE
Comment: NORMAL
Neisseria Gonorrhea: NEGATIVE
Trichomonas: NEGATIVE

## 2021-05-22 LAB — LIPID PANEL
Cholesterol: 257 mg/dL — ABNORMAL HIGH (ref <200)
HDL: 81 mg/dL (ref >=50)
LDL Cholesterol (Calc): 158 mg/dL (calc) — ABNORMAL HIGH
Non-HDL Cholesterol (Calc): 176 mg/dL (calc) — ABNORMAL HIGH (ref <130)
Total CHOL/HDL Ratio: 3.2 (calc) (ref <5.0)
Triglycerides: 81 mg/dL (ref <150)

## 2021-05-22 LAB — HEMOGLOBIN A1C
Hgb A1c MFr Bld: 5.2 % of total Hgb (ref <5.7)
Mean Plasma Glucose: 103 mg/dL
eAG (mmol/L): 5.7 mmol/L

## 2021-05-22 LAB — BASIC METABOLIC PANEL
BUN: 25 mg/dL (ref 7–25)
CO2: 30 mmol/L (ref 20–32)
Calcium: 9.9 mg/dL (ref 8.6–10.4)
Chloride: 101 mmol/L (ref 98–110)
Creat: 0.81 mg/dL (ref 0.50–1.03)
Glucose, Bld: 100 mg/dL — ABNORMAL HIGH (ref 65–99)
Potassium: 4 mmol/L (ref 3.5–5.3)
Sodium: 138 mmol/L (ref 135–146)

## 2021-05-22 LAB — HEPATITIS C ANTIBODY
Hepatitis C Ab: NONREACTIVE
SIGNAL TO CUT-OFF: 0.03 (ref <1.00)

## 2021-07-22 ENCOUNTER — Other Ambulatory Visit: Payer: Self-pay | Admitting: Family Medicine

## 2021-07-22 DIAGNOSIS — G4700 Insomnia, unspecified: Secondary | ICD-10-CM

## 2021-07-22 DIAGNOSIS — F419 Anxiety disorder, unspecified: Secondary | ICD-10-CM

## 2021-07-22 NOTE — Telephone Encounter (Signed)
Requested Prescriptions  Pending Prescriptions Disp Refills  . traZODone (DESYREL) 50 MG tablet [Pharmacy Med Name: traZODone HCl 50 MG Oral Tablet] 90 tablet 0    Sig: TAKE 1/2 TO 1 (ONE-HALF TO ONE) TABLET BY MOUTH AT BEDTIME AS NEEDED FOR SLEEP     Psychiatry: Antidepressants - Serotonin Modulator Passed - 07/22/2021 10:19 AM      Passed - Valid encounter within last 6 months    Recent Outpatient Visits          2 months ago Primary hypertension   [facility] Rory Percy M, DO   6 months ago Acute conjunctivitis of left eye, unspecified acute conjunctivitis type   [place], NP   1 year ago Anxiety disorder, unspecified type   [facility] Delsa Grana, PA-C   1 year ago Essential hypertension   [facility] Delsa Grana, PA-C   1 year ago Urinary tract infection without hematuria, site unspecified   [facility] Delsa Grana, PA-C      Future Appointments            In 4 months Reece Packer, Myna Hidalgo, [facility], [facility]

## 2021-11-19 ENCOUNTER — Other Ambulatory Visit: Payer: Self-pay

## 2021-11-19 ENCOUNTER — Encounter: Payer: Self-pay | Admitting: Nurse Practitioner

## 2021-11-19 ENCOUNTER — Ambulatory Visit (INDEPENDENT_AMBULATORY_CARE_PROVIDER_SITE_OTHER): Payer: Managed Care, Other (non HMO) | Admitting: Nurse Practitioner

## 2021-11-19 VITALS — BP 136/84 | HR 90 | Temp 98.2°F | Resp 18 | Ht 62.0 in | Wt 154.9 lb

## 2021-11-19 DIAGNOSIS — F419 Anxiety disorder, unspecified: Secondary | ICD-10-CM | POA: Diagnosis not present

## 2021-11-19 DIAGNOSIS — E782 Mixed hyperlipidemia: Secondary | ICD-10-CM | POA: Diagnosis not present

## 2021-11-19 DIAGNOSIS — G4700 Insomnia, unspecified: Secondary | ICD-10-CM

## 2021-11-19 DIAGNOSIS — N951 Menopausal and female climacteric states: Secondary | ICD-10-CM

## 2021-11-19 DIAGNOSIS — I10 Essential (primary) hypertension: Secondary | ICD-10-CM | POA: Diagnosis not present

## 2021-11-19 MED ORDER — ATENOLOL 50 MG PO TABS: 50.0000 mg | ORAL_TABLET | Freq: Every day | ORAL | 3 refills | Status: DC

## 2021-11-19 MED ORDER — TRAZODONE HCL 50 MG PO TABS: ORAL_TABLET | ORAL | 3 refills | Status: DC

## 2021-11-19 MED ORDER — CHLORTHALIDONE 25 MG PO TABS: 25.0000 mg | ORAL_TABLET | Freq: Every day | ORAL | 3 refills | Status: DC

## 2021-11-19 MED ORDER — ESCITALOPRAM OXALATE 20 MG PO TABS: 20.0000 mg | ORAL_TABLET | Freq: Every day | ORAL | 3 refills | Status: DC

## 2021-11-19 NOTE — Progress Notes (Signed)
? ?BP 136/84   Pulse 90   Temp 98.2 ?F (36.8 ?C) (Oral)   Resp 18   Ht '5\' 2"'$  (1.575 m)   Wt 154 lb 14.4 oz (70.3 kg)   SpO2 98%   BMI 28.33 kg/m?   ? ?Subjective:  ? ? Patient ID: Tammy Garcia, female    DOB: 1966/01/09, 56 y.o.   MRN: [id number] ? ?HPI: ?Tammy Garcia is a 56 y.o. female ? ?Chief Complaint  ?Patient presents with  ? Hypertension  ?  6 month follow up  ? Anxiety  ? ?HTN: Her blood pressure today is 136/84.  She is currently taking chlorthalidone 25 mg daily and atenolol 50 mg daily.  She says she has checked her lbood pressure at work and it has been normal.  ? ?Hyperlipidemia: Her last LDL was 158 on 05/21/2021.  She is not currently on medication. ?The 10-year ASCVD risk score (Arnett DK, et al., 2019) is: 4.4% ?  Values used to calculate the score: ?    Age: 70 years ?    Sex: Female ?    Is Non-Hispanic African American: Yes ?    Diabetic: No ?    Tobacco smoker: No ?    Systolic Blood Pressure: 268 mmHg ?    Is BP treated: Yes ?    HDL Cholesterol: 81 mg/dL ?    Total Cholesterol: 257 mg/dL  ? ?Anxiety/insomnia: She says she is taking the Lexapro every day and doing well with the plan.  She takes trazodone to help with sleep.  She says that if she does not take her trazodone she wakes up for a few hours every night.   ? ? ?  11/19/2021  ?  9:32 AM 05/21/2021  ?  8:34 AM 01/15/2021  ?  9:38 AM 06/05/2020  ? 10:25 AM 01/20/2020  ?  2:09 PM  ?Depression screen PHQ 2/9  ?Decreased Interest 0 0 0 3 0  ?Down, Depressed, Hopeless 0 0 0 0 0  ?PHQ - 2 Score 0 0 0 3 0  ?Altered sleeping 2 0 0 1 1  ?Tired, decreased energy 1 0 0 3 0  ?Change in appetite 2 0 0 1 1  ?Feeling bad or failure about yourself  0 0 0 0 0  ?Trouble concentrating 0 0 0 0 0  ?Moving slowly or fidgety/restless 0 0 0 0 0  ?Suicidal thoughts 0 0 0 0 0  ?PHQ-9 Score 5 0 0 8 2  ?Difficult doing work/chores Somewhat difficult Not difficult at all Not difficult at all Not difficult at all Somewhat difficult  ? ? ?  11/19/2021  ?  9:33  AM 06/05/2020  ? 10:26 AM 03/01/2019  ? 11:29 AM 12/14/2018  ? 11:32 AM  ?GAD 7 : Generalized Anxiety Score  ?Nervous, Anxious, on Edge 0 '3 2 1  '$ ?Control/stop worrying 0 0 2 1  ?Worry too much - different things 0 0 2 1  ?Trouble relaxing 0 0 2 0  ?Restless 0 0 2 0  ?Easily annoyed or irritable '1 3 2 1  '$ ?Afraid - awful might happen 0 0 2 1  ?Total GAD 7 Score '1 6 14 5  '$ ?Anxiety Difficulty Not difficult at all Not difficult at all Somewhat difficult Not difficult at all  ? ? ?Hot flashes: She says she has had hot flashes ever since she stopped having her period. She says this has been going on for awhile.  She is currently on Lexapro.  Discussed starting Black cohosh.  ?  ?Relevant past medical, surgical, family and social history reviewed and updated as indicated. Interim medical history since our last visit reviewed. ?Allergies and medications reviewed and updated. ? ?Review of Systems ? ?Constitutional: Negative for fever or weight change.  ?Respiratory: Negative for cough and shortness of breath.   ?Cardiovascular: Negative for chest pain or palpitations.  ?Gastrointestinal: Negative for abdominal pain, no bowel changes.  ?Musculoskeletal: Negative for gait problem or joint swelling.  ?Skin: Negative for rash.  ?Neurological: Negative for dizziness or headache.  ?No other specific complaints in a complete review of systems (except as listed in HPI above).  ? ?   ?Objective:  ?  ?BP 136/84   Pulse 90   Temp 98.2 ?F (36.8 ?C) (Oral)   Resp 18   Ht '5\' 2"'$  (1.575 m)   Wt 154 lb 14.4 oz (70.3 kg)   SpO2 98%   BMI 28.33 kg/m?   ?Wt Readings from Last 3 Encounters:  ?11/19/21 154 lb 14.4 oz (70.3 kg)  ?05/21/21 147 lb 4.8 oz (66.8 kg)  ?01/15/21 145 lb 4.8 oz (65.9 kg)  ?  ?Physical Exam ? ?Constitutional: Patient appears well-developed and well-nourished.  No distress.  ?HEENT: head atraumatic, normocephalic, pupils equal and reactive to light, neck supple ?Cardiovascular: Normal rate, regular rhythm and normal  heart sounds.  No murmur heard. No BLE edema. ?Pulmonary/Chest: Effort normal and breath sounds normal. No respiratory distress. ?Abdominal: Soft.  There is no tenderness. ?Psychiatric: Patient has a normal mood and affect. behavior is normal. Judgment and thought content normal.  ?Results for orders placed or performed in visit on 05/21/21  ?Basic Metabolic Panel (BMET)  ?Result Value Ref Range  ? Glucose, Bld 100 (H) 65 - 99 mg/dL  ? BUN 25 7 - 25 mg/dL  ? Creat 0.81 0.50 - 1.03 mg/dL  ? BUN/Creatinine Ratio NOT APPLICABLE 6 - 22 (calc)  ? Sodium 138 135 - 146 mmol/L  ? Potassium 4.0 3.5 - 5.3 mmol/L  ? Chloride 101 98 - 110 mmol/L  ? CO2 30 20 - 32 mmol/L  ? Calcium 9.9 8.6 - 10.4 mg/dL  ?Lipid panel  ?Result Value Ref Range  ? Cholesterol 257 (H) <200 mg/dL  ? HDL 81 > OR = 50 mg/dL  ? Triglycerides 81 <150 mg/dL  ? LDL Cholesterol (Calc) 158 (H) mg/dL (calc)  ? Total CHOL/HDL Ratio 3.2 <5.0 (calc)  ? Non-HDL Cholesterol (Calc) 176 (H) <130 mg/dL (calc)  ?Hemoglobin A1c  ?Result Value Ref Range  ? Hgb A1c MFr Bld 5.2 <5.7 % of total Hgb  ? Mean Plasma Glucose 103 mg/dL  ? eAG (mmol/L) 5.7 mmol/L  ?Hepatitis C antibody  ?Result Value Ref Range  ? Hepatitis C Ab NON-REACTIVE NON-REACTIVE  ? SIGNAL TO CUT-OFF 0.03 <1.00  ?Cervicovaginal ancillary only  ?Result Value Ref Range  ? Neisseria Gonorrhea Negative   ? Chlamydia Negative   ? Trichomonas Negative   ? Bacterial Vaginitis (gardnerella) Negative   ? Candida Vaginitis Negative   ? Candida Glabrata Negative   ? Comment    ?  Normal Reference Range Bacterial Vaginosis - Negative  ? Comment Normal Reference Range Candida Species - Negative   ? Comment Normal Reference Range Candida Galbrata - Negative   ? Comment Normal Reference Range Trichomonas - Negative   ? Comment Normal Reference [place] Chlamydia - Negative   ? Comment    ?  Normal Reference Range Neisseria Gonorrhea - Negative  ? ?   ?  Assessment & Plan:  ? ?1. Essential hypertension ?-continue current  treatment ?- chlorthalidone (HYGROTON) 25 MG tablet; Take 1 tablet (25 mg total) by mouth daily.  Dispense: 90 tablet; Refill: 3 ?- atenolol (TENORMIN) 50 MG tablet; Take 1 tablet (50 mg total) by mouth daily.  Dispense: 90 tablet; Refill: 3 ? ?2. Mixed hyperlipidemia ?-discussed diet modification ? ?3. Anxiety ?-continue current treatment ?- traZODone (DESYREL) 50 MG tablet; TAKE 1/2 TO 1 (ONE-HALF TO ONE) TABLET BY MOUTH AT BEDTIME AS NEEDED FOR SLEEP  Dispense: 90 tablet; Refill: 3 ?- escitalopram (LEXAPRO) 20 MG tablet; Take 1 tablet (20 mg total) by mouth daily.  Dispense: 90 tablet; Refill: 3 ? ?4. Insomnia, unspecified type ?-continue current treatment ?- traZODone (DESYREL) 50 MG tablet; TAKE 1/2 TO 1 (ONE-HALF TO ONE) TABLET BY MOUTH AT BEDTIME AS NEEDED FOR SLEEP  Dispense: 90 tablet; Refill: 3 ?- escitalopram (LEXAPRO) 20 MG tablet; Take 1 tablet (20 mg total) by mouth daily.  Dispense: 90 tablet; Refill: 3 ? ?5. Hot flashes due to menopause ?-try black cohosh ?- escitalopram (LEXAPRO) 20 MG tablet; Take 1 tablet (20 mg total) by mouth daily.  Dispense: 90 tablet; Refill: 3  ? ?Follow up plan: ?Return in about 6 months (around 05/21/2022) for follow up. ? ? ? ? ? ?

## 2022-04-16 ENCOUNTER — Encounter: Payer: Self-pay | Admitting: Nurse Practitioner

## 2022-05-23 ENCOUNTER — Encounter: Payer: Managed Care, Other (non HMO) | Admitting: Nurse Practitioner

## 2022-05-27 ENCOUNTER — Encounter: Payer: Self-pay | Admitting: Nurse Practitioner

## 2022-05-27 ENCOUNTER — Ambulatory Visit: Payer: Managed Care, Other (non HMO) | Admitting: Nurse Practitioner

## 2022-05-27 ENCOUNTER — Ambulatory Visit (INDEPENDENT_AMBULATORY_CARE_PROVIDER_SITE_OTHER): Payer: Managed Care, Other (non HMO) | Admitting: Nurse Practitioner

## 2022-05-27 ENCOUNTER — Other Ambulatory Visit: Payer: Self-pay

## 2022-05-27 VITALS — BP 128/84 | HR 73 | Temp 97.8°F | Resp 18 | Ht 62.0 in | Wt 157.2 lb

## 2022-05-27 DIAGNOSIS — E782 Mixed hyperlipidemia: Secondary | ICD-10-CM | POA: Diagnosis not present

## 2022-05-27 DIAGNOSIS — Z0000 Encounter for general adult medical examination without abnormal findings: Secondary | ICD-10-CM

## 2022-05-27 DIAGNOSIS — Z131 Encounter for screening for diabetes mellitus: Secondary | ICD-10-CM

## 2022-05-27 DIAGNOSIS — Z1231 Encounter for screening mammogram for malignant neoplasm of breast: Secondary | ICD-10-CM

## 2022-05-27 DIAGNOSIS — I10 Essential (primary) hypertension: Secondary | ICD-10-CM | POA: Diagnosis not present

## 2022-05-27 NOTE — Progress Notes (Deleted)
There were no vitals taken for this visit.   Subjective:    Patient ID: Tammy Garcia, female    DOB: Sep 25, 1965, 56 y.o.   MRN: [id number]  HPI: Tammy Garcia is a 56 y.o. female  No chief complaint on file.  HTN: She is currently taking Thalitone 25 mg daily and atenolol 50 mg daily.  Blood pressure today is ***.  Denies any headaches, blurred vision, shortness of breath or chest pain.  Hyperlipidemia:She is not currently on cholesterol-lowering medication.  Her last LDL was 158 on 05/21/2021.  We will get labs today.  The 10-year ASCVD risk score (Arnett DK, et al., 2019) is: 4.4%   Values used to calculate the score:     Age: 56 years     Sex: Female     Is Non-Hispanic African American: Yes     Diabetic: No     Tobacco smoker: No     Systolic Blood Pressure: 379 mmHg     Is BP treated: Yes     HDL Cholesterol: 81 mg/dL     Total Cholesterol: 257 mg/dL   Anxiety/insomnia: She is currently taking Lexapro 20 mg daily.  She also takes trazodone 50 mg to help her sleep.     11/19/2021    9:32 AM 05/21/2021    8:34 AM 01/15/2021    9:38 AM 06/05/2020   10:25 AM 01/20/2020    2:09 PM  Depression screen PHQ 2/9  Decreased Interest 0 0 0 3 0  Down, Depressed, Hopeless 0 0 0 0 0  PHQ - 2 Score 0 0 0 3 0  Altered sleeping 2 0 0 1 1  Tired, decreased energy 1 0 0 3 0  Change in appetite 2 0 0 1 1  Feeling bad or failure about yourself  0 0 0 0 0  Trouble concentrating 0 0 0 0 0  Moving slowly or fidgety/restless 0 0 0 0 0  Suicidal thoughts 0 0 0 0 0  PHQ-9 Score 5 0 0 8 2  Difficult doing work/chores Somewhat difficult Not difficult at all Not difficult at all Not difficult at all Somewhat difficult      11/19/2021    9:33 AM 06/05/2020   10:26 AM 03/01/2019   11:29 AM 12/14/2018   11:32 AM  GAD 7 : Generalized Anxiety Score  Nervous, Anxious, on Edge 0 '3 2 1  '$ Control/stop worrying 0 0 2 1  Worry too much - different things 0 0 2 1  Trouble relaxing 0 0 2 0  Restless  0 0 2 0  Easily annoyed or irritable '1 3 2 1  '$ Afraid - awful might happen 0 0 2 1  Total GAD 7 Score '1 6 14 5  '$ Anxiety Difficulty Not difficult at all Not difficult at all Somewhat difficult Not difficult at all    Hot flashes: Visit we discussed her hot flashes.  She says she had been having hot flashes ever since she stopped having her period.  She says has been going on for a while.  She is currently on Lexapro.  At that appointment we did discuss starting black cohosh.  She reports ***.     Relevant past medical, surgical, family and social history reviewed and updated as indicated. Interim medical history since our last visit reviewed. Allergies and medications reviewed and updated.  Review of Systems  Constitutional: Negative for fever or weight change.  Respiratory: Negative for cough and shortness of breath.  Cardiovascular: Negative for chest pain or palpitations.  Gastrointestinal: Negative for abdominal pain, no bowel changes.  Musculoskeletal: Negative for gait problem or joint swelling.  Skin: Negative for rash.  Neurological: Negative for dizziness or headache.  No other specific complaints in a complete review of systems (except as listed in HPI above).      Objective:    There were no vitals taken for this visit.  Wt Readings from Last 3 Encounters:  11/19/21 154 lb 14.4 oz (70.3 kg)  05/21/21 147 lb 4.8 oz (66.8 kg)  01/15/21 145 lb 4.8 oz (65.9 kg)    Physical Exam  Constitutional: Patient appears well-developed and well-nourished.  No distress.  HEENT: head atraumatic, normocephalic, pupils equal and reactive to light, neck supple Cardiovascular: Normal rate, regular rhythm and normal heart sounds.  No murmur heard. No BLE edema. Pulmonary/Chest: Effort normal and breath sounds normal. No respiratory distress. Abdominal: Soft.  There is no tenderness. Psychiatric: Patient has a normal mood and affect. behavior is normal. Judgment and thought content normal.   Results for orders placed or performed in visit on 09/98/33  Basic Metabolic Panel (BMET)  Result Value Ref Range   Glucose, Bld 100 (H) 65 - 99 mg/dL   BUN 25 7 - 25 mg/dL   Creat 0.81 0.50 - 1.03 mg/dL   BUN/Creatinine Ratio NOT APPLICABLE 6 - 22 (calc)   Sodium 138 135 - 146 mmol/L   Potassium 4.0 3.5 - 5.3 mmol/L   Chloride 101 98 - 110 mmol/L   CO2 30 20 - 32 mmol/L   Calcium 9.9 8.6 - 10.4 mg/dL  Lipid panel  Result Value Ref Range   Cholesterol 257 (H) <200 mg/dL   HDL 81 > OR = 50 mg/dL   Triglycerides 81 <150 mg/dL   LDL Cholesterol (Calc) 158 (H) mg/dL (calc)   Total CHOL/HDL Ratio 3.2 <5.0 (calc)   Non-HDL Cholesterol (Calc) 176 (H) <130 mg/dL (calc)  Hemoglobin A1c  Result Value Ref Range   Hgb A1c MFr Bld 5.2 <5.7 % of total Hgb   Mean Plasma Glucose 103 mg/dL   eAG (mmol/L) 5.7 mmol/L  Hepatitis C antibody  Result Value Ref Range   Hepatitis C Ab NON-REACTIVE NON-REACTIVE   SIGNAL TO CUT-OFF 0.03 <1.00  Cervicovaginal ancillary only  Result Value Ref Range   Neisseria Gonorrhea Negative    Chlamydia Negative    Trichomonas Negative    Bacterial Vaginitis (gardnerella) Negative    Candida Vaginitis Negative    Candida Glabrata Negative    Comment      Normal Reference Range Bacterial Vaginosis - Negative   Comment Normal Reference Range Candida Species - Negative    Comment Normal Reference Range Candida Galbrata - Negative    Comment Normal Reference Range Trichomonas - Negative    Comment Normal Reference Ranger Chlamydia - Negative    Comment      Normal Reference Range Neisseria Gonorrhea - Negative      Assessment & Plan:   Problem List Items Addressed This Visit   None   Follow up plan: No follow-ups on file.

## 2022-05-27 NOTE — Progress Notes (Signed)
Name: Tammy Garcia   MRN: [id number]    DOB: 05-23-1966   Date:05/27/2022       Progress Note  Subjective  Chief Complaint  Chief Complaint  Patient presents with   Annual Exam    HPI  Patient presents for annual CPE.  Diet: she eats what she wants, not a lot of dairy Exercise: dance, exercising at least 30 min a day  Sleep: 6 hours, gets up around 2-3 but is able to go back to sleep Last dental exam:she is due Last eye exam: two years ago  [employer] Visit from 05/27/[facility] in [facility]  AUDIT-C Score 1      Depression: Phq 9 is  negative    05/27/2022   10:04 AM 11/19/2021    9:32 AM 05/21/2021    8:34 AM 01/15/2021    9:38 AM 06/05/2020   10:25 AM  Depression screen PHQ 2/9  Decreased Interest 0 0 0 0 3  Down, Depressed, Hopeless 0 0 0 0 0  PHQ - 2 Score 0 0 0 0 3  Altered sleeping 1 2 0 0 1  Tired, decreased energy 0 1 0 0 3  Change in appetite 0 2 0 0 1  Feeling bad or failure about yourself  0 0 0 0 0  Trouble concentrating 0 0 0 0 0  Moving slowly or fidgety/restless 0 0 0 0 0  Suicidal thoughts 0 0 0 0 0  PHQ-9 Score 1 5 0 0 8  Difficult doing work/chores Not difficult at all Somewhat difficult Not difficult at all Not difficult at all Not difficult at all   Hypertension: BP Readings from Last 3 Encounters:  05/27/22 128/84  11/19/21 136/84  05/21/21 120/82   Obesity: Wt Readings from Last 3 Encounters:  05/27/22 157 lb 3.2 oz (71.3 kg)  11/19/21 154 lb 14.4 oz (70.3 kg)  05/21/21 147 lb 4.8 oz (66.8 kg)   BMI Readings from Last 3 Encounters:  05/27/22 28.75 kg/m  11/19/21 28.33 kg/m  05/21/21 26.94 kg/m     Vaccines:  HPV: up to at age 69 , ask insurance if age between 77-45  Shingrix: 1-64 yo and ask insurance if covered when patient above 37 yo Pneumonia:  educated and discussed with patient. Flu:  educated and discussed with patient.  Hep C Screening: 05/21/2021 STD testing and prevention  (HIV/chl/gon/syphilis): 08/17/2019 Intimate partner violence:none Sexual History : sparingly Menstrual History/LMP/Abnormal Bleeding: postmenopausal Incontinence Symptoms: stress incontinence  Breast cancer:  - Last Mammogram: 04/20/2021 - BRCA gene screening: none  Osteoporosis: Discussed high calcium and vitamin D supplementation, weight bearing exercises  Cervical cancer screening: 08/17/2019  Skin cancer: Discussed monitoring for atypical lesions  Colorectal cancer: 03/16/2020   Lung cancer:   Low Dose CT Chest recommended if Age 73-80 years, 20 pack-year currently smoking OR have quit w/in 15years. Patient does not qualify.   ECG: none  Advanced Care Planning: A voluntary discussion about advance care planning including the explanation and discussion of advance directives.  Discussed health care proxy and Living will, and the patient was able to identify a health care proxy as sister.  Patient does not have a living will at present time. If patient does have living will, I have requested they bring this to the clinic to be scanned in to their chart.  Lipids: Lab Results  Component Value Date   CHOL 257 (H) 05/21/2021   CHOL 227 (H) 08/17/2019   CHOL 213 (H) 01/11/2019  Lab Results  Component Value Date   HDL 81 05/21/2021   HDL 71 08/17/2019   HDL 61 01/11/2019   Lab Results  Component Value Date   LDLCALC 158 (H) 05/21/2021   LDLCALC 137 (H) 08/17/2019   LDLCALC 133 (H) 01/11/2019   Lab Results  Component Value Date   TRIG 81 05/21/2021   TRIG 93 08/17/2019   TRIG 89 01/11/2019   Lab Results  Component Value Date   CHOLHDL 3.2 05/21/2021   CHOLHDL 3.2 08/17/2019   CHOLHDL 3.5 01/11/2019   No results found for: "LDLDIRECT"  Glucose: Glucose  Date Value Ref Range Status  03/16/2014 135 (H) 65 - 99 mg/dL Final   Glucose, Bld  Date Value Ref Range Status  05/21/2021 100 (H) 65 - 99 mg/dL Final    Comment:    .            Fasting reference interval . For  someone without known diabetes, a glucose value between 100 and 125 mg/dL is consistent with prediabetes and should be confirmed with a follow-up test. .   08/17/2019 74 65 - 99 mg/dL Final    Comment:    .            Fasting reference interval .   01/11/2019 82 65 - 99 mg/dL Final    Comment:    .            Fasting reference interval .     Patient Active Problem List   Diagnosis Date Noted   Polyp of colon    Pure hypercholesterolemia 11/30/2019   Insomnia 11/30/2019   Anxiety 11/30/2019   Tobacco use 11/30/2019   Onychomycosis with ingrown toenail 12/14/2018   Perimenopausal vasomotor symptoms 12/14/2018   Hypertensive disorder 10/08/2018    Past Surgical History:  Procedure Laterality Date   COLONOSCOPY WITH PROPOFOL N/A 03/16/2020   Procedure: COLONOSCOPY WITH BIOPSY;  Surgeon: Lucilla Lame, MD;  Location: [facility];  Service: Endoscopy;  Laterality: N/A;  priority 4   POLYPECTOMY N/A 03/16/2020   Procedure: POLYPECTOMY;  Surgeon: Lucilla Lame, MD;  Location: [facility];  Service: Endoscopy;  Laterality: N/A;   TUBAL LIGATION      Family History  Problem Relation Age of Onset   Cirrhosis Father    Hyperlipidemia Sister    Diabetes Maternal Aunt    Kidney disease Maternal Aunt    Hypertension Maternal Aunt    Breast cancer Neg Hx     Social History   Socioeconomic History   Marital status: Significant Other    Spouse name: Not on file   Number of children: 3   Years of education: 12   Highest education level: Not on file  Occupational History   Not on file  Tobacco Use   Smoking status: Former    Packs/day: 0.15    Years: 10.00    Total pack years: 1.50    Types: Cigarettes    Quit date: 01/25/2021    Years since quitting: 1.3   Smokeless tobacco: Never   Tobacco comments:    3 cigs/day  Vaping Use   Vaping Use: Never used  Substance and Sexual Activity   Alcohol use: Yes    Alcohol/week: 7.0 standard drinks of alcohol     Types: 7 Cans of beer per week   Drug use: Yes    Types: Marijuana    Comment: last use a month ago   Sexual activity: Yes  Other Topics  Concern   Not on file  Social History Narrative   Not on file   Social Determinants of Health   Financial Resource Strain: Low Risk  (05/27/2022)   Overall Financial Resource Strain (CARDIA)    Difficulty of Paying Living Expenses: Not hard at all  Food Insecurity: No Food Insecurity (05/27/2022)   Hunger Vital Sign    Worried About Running Out of Food in the Last Year: Never true    Ran Out of Food in the Last Year: Never true  Transportation Needs: No Transportation Needs (05/27/2022)   PRAPARE - Hydrologist (Medical): No    Lack of Transportation (Non-Medical): No  Physical Activity: Insufficiently Active (05/27/2022)   Exercise Vital Sign    Days of Exercise per Week: 3 days    Minutes of Exercise per Session: 30 min  Stress: Stress Concern Present (05/27/2022)   [place]    Feeling of Stress : Very much  Social Connections: Moderately Integrated (05/27/2022)   Social Connection and Isolation Panel [NHANES]    Frequency of Communication with Friends and Family: More than three times a week    Frequency of Social Gatherings with Friends and Family: More than three times a week    Attends Religious Services: More than 4 times per year    Active Member of [employer] or Organizations: No    Attends Archivist Meetings: Never    Marital Status: Living with partner  Intimate Partner Violence: Not At Risk (05/27/2022)   Humiliation, Afraid, Rape, and Kick questionnaire    Fear of Current or Ex-Partner: No    Emotionally Abused: No    Physically Abused: No    Sexually Abused: No     Current Outpatient Medications:    atenolol (TENORMIN) 50 MG tablet, Take 1 tablet (50 mg total) by mouth daily., Disp: 90 tablet, Rfl: 3   chlorthalidone  (HYGROTON) 25 MG tablet, Take 1 tablet (25 mg total) by mouth daily., Disp: 90 tablet, Rfl: 3   escitalopram (LEXAPRO) 20 MG tablet, Take 1 tablet (20 mg total) by mouth daily., Disp: 90 tablet, Rfl: 3   traZODone (DESYREL) 50 MG tablet, TAKE 1/2 TO 1 (ONE-HALF TO ONE) TABLET BY MOUTH AT BEDTIME AS NEEDED FOR SLEEP, Disp: 90 tablet, Rfl: 3  No Known Allergies   ROS  Constitutional: Negative for fever or weight change.  Respiratory: Negative for cough and shortness of breath.   Cardiovascular: Negative for chest pain or palpitations.  Gastrointestinal: Negative for abdominal pain, no bowel changes.  Musculoskeletal: Negative for gait problem or joint swelling.  Skin: Negative for rash.  Neurological: Negative for dizziness or headache.  No other specific complaints in a complete review of systems (except as listed in HPI above).   Objective  Vitals:   05/27/22 0958  BP: 128/84  Pulse: 73  Resp: 18  Temp: 97.8 F (36.6 C)  TempSrc: Oral  SpO2: 99%  Weight: 157 lb 3.2 oz (71.3 kg)  Height: 5' 2"  (1.575 m)    Body mass index is 28.75 kg/m.  Physical Exam  Constitutional: Patient appears well-developed and well-nourished. No distress.  HENT: Head: Normocephalic and atraumatic. Ears: B TMs ok, no erythema or effusion; Nose: Nose normal. Mouth/Throat: Oropharynx is clear and moist. No oropharyngeal exudate.  Eyes: Conjunctivae and EOM are normal. Pupils are equal, round, and reactive to light. No scleral icterus.  Neck: Normal range of motion. Neck supple.  No JVD present. No thyromegaly present.  Cardiovascular: Normal rate, regular rhythm and normal heart sounds.  No murmur heard. No BLE edema. Pulmonary/Chest: Effort normal and breath sounds normal. No respiratory distress. Abdominal: Soft. Bowel sounds are normal, no distension. There is no tenderness. no masses Breast: no lumps or masses, no nipple discharge or rashes Musculoskeletal: Normal range of motion, no joint  effusions. No gross deformities Neurological: he is alert and oriented to person, place, and time. No cranial nerve deficit. Coordination, balance, strength, speech and gait are normal.  Skin: Skin is warm and dry. No rash noted. No erythema.  Psychiatric: Patient has a normal mood and affect. behavior is normal. Judgment and thought content normal.   No results found for this or any previous visit (from the past 2160 hour(s)).    Fall Risk:    05/27/2022   10:04 AM 11/19/2021    9:32 AM 05/21/2021    8:34 AM 01/15/2021    9:38 AM 06/05/2020   10:25 AM  Fall Risk   Falls in the past year? 0 0 0 0 0  Number falls in past yr: 0 0 0 0 0  Injury with Fall? 0 0 0 0 0  Follow up Falls evaluation completed Falls evaluation completed   Falls evaluation completed    Functional Status Survey: Is the patient deaf or have difficulty hearing?: No Does the patient have difficulty seeing, even when wearing glasses/contacts?: No Does the patient have difficulty concentrating, remembering, or making decisions?: No Does the patient have difficulty walking or climbing stairs?: No Does the patient have difficulty dressing or bathing?: No Does the patient have difficulty doing errands alone such as visiting a doctor's office or shopping?: No   Assessment & Plan  1. Annual physical exam  - MM 3D SCREEN BREAST BILATERAL; Future - Lipid panel - CBC with Differential/Platelet - COMPLETE METABOLIC PANEL WITH GFR - Hemoglobin A1c  2. Encounter for screening mammogram for malignant neoplasm of breast  - MM 3D SCREEN BREAST BILATERAL; Future  3. Essential hypertension  - CBC with Differential/Platelet - COMPLETE METABOLIC PANEL WITH GFR  4. Mixed hyperlipidemia  - Lipid panel  5. Screening for diabetes mellitus  - Hemoglobin A1c   -USPSTF grade A and B recommendations reviewed with patient; age-appropriate recommendations, preventive care, screening tests, etc discussed and encouraged;  healthy living encouraged; see AVS for patient education given to patient -Discussed importance of 150 minutes of physical activity weekly, eat two servings of fish weekly, eat one serving of tree nuts ( cashews, pistachios, pecans, almonds.Marland Kitchen) every other day, eat 6 servings of fruit/vegetables daily and drink plenty of water and avoid sweet beverages.

## 2022-05-28 LAB — HEMOGLOBIN A1C
Hgb A1c MFr Bld: 5.9 % of total Hgb — ABNORMAL HIGH (ref <5.7)
Mean Plasma Glucose: 123 mg/dL
eAG (mmol/L): 6.8 mmol/L

## 2022-05-28 LAB — COMPLETE METABOLIC PANEL WITH GFR
AG Ratio: 1.6 (calc) (ref 1.0–2.5)
ALT: 18 U/L (ref 6–29)
AST: 17 U/L (ref 10–35)
Albumin: 4.4 g/dL (ref 3.6–5.1)
Alkaline phosphatase (APISO): 96 U/L (ref 37–153)
BUN: 21 mg/dL (ref 7–25)
CO2: 29 mmol/L (ref 20–32)
Calcium: 9.9 mg/dL (ref 8.6–10.4)
Chloride: 103 mmol/L (ref 98–110)
Creat: 0.94 mg/dL (ref 0.50–1.03)
Globulin: 2.8 g/dL (calc) (ref 1.9–3.7)
Glucose, Bld: 82 mg/dL (ref 65–99)
Potassium: 4.5 mmol/L (ref 3.5–5.3)
Sodium: 140 mmol/L (ref 135–146)
Total Bilirubin: 0.3 mg/dL (ref 0.2–1.2)
Total Protein: 7.2 g/dL (ref 6.1–8.1)
eGFR: 73 mL/min/{1.73_m2} (ref >=60)

## 2022-05-28 LAB — CBC WITH DIFFERENTIAL/PLATELET
Absolute Monocytes: 714 cells/uL (ref 200–950)
Basophils Absolute: 28 cells/uL (ref 0–200)
Basophils Relative: 0.4 %
Eosinophils Absolute: 49 cells/uL (ref 15–500)
Eosinophils Relative: 0.7 %
HCT: 38.4 % (ref 35.0–45.0)
Hemoglobin: 13.4 g/dL (ref 11.7–15.5)
Lymphs Abs: 2618 cells/uL (ref 850–3900)
MCH: 32.8 pg (ref 27.0–33.0)
MCHC: 34.9 g/dL (ref 32.0–36.0)
MCV: 93.9 fL (ref 80.0–100.0)
MPV: 9.9 fL (ref 7.5–12.5)
Monocytes Relative: 10.2 %
Neutro Abs: 3591 cells/uL (ref 1500–7800)
Neutrophils Relative %: 51.3 %
Platelets: 315 10*3/uL (ref 140–400)
RBC: 4.09 10*6/uL (ref 3.80–5.10)
RDW: 13.2 % (ref 11.0–15.0)
Total Lymphocyte: 37.4 %
WBC: 7 10*3/uL (ref 3.8–10.8)

## 2022-05-28 LAB — LIPID PANEL
Cholesterol: 249 mg/dL — ABNORMAL HIGH (ref <200)
HDL: 68 mg/dL (ref >=50)
LDL Cholesterol (Calc): 159 mg/dL (calc) — ABNORMAL HIGH
Non-HDL Cholesterol (Calc): 181 mg/dL (calc) — ABNORMAL HIGH (ref <130)
Total CHOL/HDL Ratio: 3.7 (calc) (ref <5.0)
Triglycerides: 107 mg/dL (ref <150)

## 2022-06-10 ENCOUNTER — Other Ambulatory Visit: Payer: Self-pay

## 2022-06-10 ENCOUNTER — Telehealth (INDEPENDENT_AMBULATORY_CARE_PROVIDER_SITE_OTHER): Payer: Managed Care, Other (non HMO) | Admitting: Nurse Practitioner

## 2022-06-10 ENCOUNTER — Encounter: Payer: Self-pay | Admitting: Nurse Practitioner

## 2022-06-10 DIAGNOSIS — J069 Acute upper respiratory infection, unspecified: Secondary | ICD-10-CM | POA: Diagnosis not present

## 2022-06-10 MED ORDER — ALBUTEROL SULFATE HFA 108 (90 BASE) MCG/ACT IN AERS: 2.0000 | INHALATION_SPRAY | Freq: Four times a day (QID) | RESPIRATORY_TRACT | 0 refills | Status: AC | PRN

## 2022-06-10 MED ORDER — PREDNISONE 10 MG (21) PO TBPK: ORAL_TABLET | ORAL | 0 refills | Status: DC

## 2022-06-10 MED ORDER — BENZONATATE 100 MG PO CAPS: 200.0000 mg | ORAL_CAPSULE | Freq: Two times a day (BID) | ORAL | 0 refills | Status: DC | PRN

## 2022-06-10 NOTE — Progress Notes (Signed)
Name: Tammy Garcia   MRN: [id number]    DOB: [birth year]   Date:06/10/2022       Progress Note  Subjective  Chief Complaint  Chief Complaint  Patient presents with   URI    Nasal and chest congestion, cough, wheezing for 3 days. Covid test negative    I connected with  Tammy Garcia  on 06/10/22 at  9:40 AM EDT by a video enabled telemedicine application and verified that I am speaking with the correct person using two identifiers.  I discussed the limitations of evaluation and management by telemedicine and the availability of in person appointments. The patient expressed understanding and agreed to proceed with a virtual visit  Staff also discussed with the patient that there may be a patient responsible charge related to this service. Patient Location: home Provider Location: [facility] Additional Individuals present: alone  HPI  URI: Patient reports nasal and chest congestion, cough and wheezing for 3 days.  Patient did a home COVID test and it was negative.  Patient denies any fever.  Patient reports she has taken Alka-Seltzer cold.  Recommend patient start taking allergy pill like Zyrtec or Claritin, Flonase and Mucinex.  Patient reports she does not like Mucinex and does not like Flonase.  We will send in prescription for Tessalon Perles, steroid taper and albuterol inhaler.  Patient Active Problem List   Diagnosis Date Noted   Polyp of colon    Pure hypercholesterolemia 11/30/2019   Insomnia 11/30/2019   Anxiety 11/30/2019   Tobacco use 11/30/2019   Onychomycosis with ingrown toenail 12/14/2018   Perimenopausal vasomotor symptoms 12/14/2018   Hypertensive disorder 10/08/2018    Social History   Tobacco Use   Smoking status: Former    Packs/day: 0.15    Years: 10.00    Total pack years: 1.50    Types: Cigarettes    Quit date: 01/25/2021    Years since quitting: 1.3   Smokeless tobacco: Never   Tobacco comments:    3 cigs/day  Substance Use Topics   Alcohol use: Yes     Alcohol/week: 7.0 standard drinks of alcohol    Types: 7 Cans of beer per week     Current Outpatient Medications:    atenolol (TENORMIN) 50 MG tablet, Take 1 tablet (50 mg total) by mouth daily., Disp: 90 tablet, Rfl: 3   chlorthalidone (HYGROTON) 25 MG tablet, Take 1 tablet (25 mg total) by mouth daily., Disp: 90 tablet, Rfl: 3   escitalopram (LEXAPRO) 20 MG tablet, Take 1 tablet (20 mg total) by mouth daily., Disp: 90 tablet, Rfl: 3   traZODone (DESYREL) 50 MG tablet, TAKE 1/2 TO 1 (ONE-HALF TO ONE) TABLET BY MOUTH AT BEDTIME AS NEEDED FOR SLEEP, Disp: 90 tablet, Rfl: 3  No Known Allergies  I personally reviewed active problem list, medication list, allergies with the patient/caregiver today.  ROS  Constitutional: Negative for fever or weight change.  HEENT: Positive for nasal congestion Respiratory: Positive for cough and wheezing, negative shortness of breath.   Cardiovascular: Negative for chest pain or palpitations.  Gastrointestinal: Negative for abdominal pain, no bowel changes.  Musculoskeletal: Negative for gait problem or joint swelling.  Skin: Negative for rash.  Neurological: Negative for dizziness or headache.  No other specific complaints in a complete review of systems (except as listed in HPI above).   Objective  Virtual encounter, vitals not obtained.  There is no height or weight on file to calculate BMI.  Nursing Note and Vital Signs  reviewed.  Physical Exam  Awake, alert, oriented x3 speaking in complete sentences.  No results found for this or any previous visit (from the past 72 hour(s)).  Assessment & Plan  1. Viral upper respiratory tract infection Recommend pushing fluids and getting rest Recommend patient take allergy pill like Zyrtec or Claritin. - albuterol (VENTOLIN HFA) 108 (90 Base) MCG/ACT inhaler; Inhale 2 puffs into the lungs every 6 (six) hours as needed for wheezing or shortness of breath.  Dispense: 8 g; Refill: 0 - predniSONE  (STERAPRED UNI-PAK 21 TAB) 10 MG (21) TBPK tablet; Take as directed on package.  (60 mg po on day 1, 50 mg po on day 2...)  Dispense: 21 tablet; Refill: 0 - benzonatate (TESSALON) 100 MG capsule; Take 2 capsules (200 mg total) by mouth 2 (two) times daily as needed for cough.  Dispense: 20 capsule; Refill: 0   -Red flags and when to present for emergency care or RTC including fever >101.70F, chest pain, shortness of breath, new/worsening/un-resolving symptoms,  reviewed with patient at time of visit. Follow up and care instructions discussed and provided in AVS. - I discussed the assessment and treatment plan with the patient. The patient was provided an opportunity to ask questions and all were answered. The patient agreed with the plan and demonstrated an understanding of the instructions.  I provided 15 minutes of non-face-to-face time during this encounter.  Bo Merino, FNP

## 2022-06-24 ENCOUNTER — Telehealth: Payer: Managed Care, Other (non HMO) | Admitting: Nurse Practitioner

## 2022-07-08 ENCOUNTER — Ambulatory Visit: Payer: Managed Care, Other (non HMO) | Admitting: Nurse Practitioner

## 2022-09-10 ENCOUNTER — Ambulatory Visit
Admission: RE | Admit: 2022-09-10 | Discharge: 2022-09-10 | Disposition: A | Discharge status: Home / Self Care | Payer: Managed Care, Other (non HMO) | Source: Ambulatory Visit | Attending: Nurse Practitioner | Admitting: Nurse Practitioner

## 2022-09-10 DIAGNOSIS — Z1231 Encounter for screening mammogram for malignant neoplasm of breast: Secondary | ICD-10-CM | POA: Diagnosis present

## 2022-09-10 DIAGNOSIS — Z0000 Encounter for general adult medical examination without abnormal findings: Secondary | ICD-10-CM | POA: Diagnosis not present

## 2022-12-08 ENCOUNTER — Other Ambulatory Visit: Payer: Self-pay | Admitting: Nurse Practitioner

## 2022-12-08 DIAGNOSIS — I10 Essential (primary) hypertension: Secondary | ICD-10-CM

## 2022-12-08 DIAGNOSIS — F419 Anxiety disorder, unspecified: Secondary | ICD-10-CM

## 2022-12-08 DIAGNOSIS — G4700 Insomnia, unspecified: Secondary | ICD-10-CM

## 2022-12-08 DIAGNOSIS — N951 Menopausal and female climacteric states: Secondary | ICD-10-CM

## 2022-12-10 NOTE — Telephone Encounter (Signed)
Patient needs F/U OV, will refill medication for 30 days until OV can be made. OV needed for additional refills.  Requested Prescriptions  Pending Prescriptions Disp Refills   atenolol (TENORMIN) 50 MG tablet [Pharmacy Med Name: Atenolol 50 MG Oral Tablet] 30 tablet 0    Sig: Take 1 tablet by mouth once daily     Cardiovascular: Beta Blockers 2 Failed - 12/08/2022 10:36 AM      Failed - Valid encounter within last 6 months    Recent Outpatient Visits           6 months ago Viral upper respiratory tract infection   [place] [facility] Berniece Salines, FNP   6 months ago Annual physical exam   [facility] Berniece Salines, FNP   1 year ago Essential hypertension   [facility] Berniece Salines, FNP   1 year ago Primary hypertension   [facility] Caro Laroche, DO   1 year ago Acute conjunctivitis of left eye, unspecified acute conjunctivitis type   [facility] Gabriel Cirri, NP              Passed - Cr in normal range and within 360 days    Creat  Date Value Ref Range Status  05/27/2022 0.94 0.50 - 1.03 mg/dL Final         Passed - Last BP in normal range    BP Readings from Last 1 Encounters:  05/27/22 128/84         Passed - Last Heart Rate in normal range    Pulse Readings from Last 1 Encounters:  05/27/22 73          chlorthalidone (HYGROTON) 25 MG tablet [Pharmacy Med Name: Chlorthalidone 25 MG Oral Tablet] 30 tablet 0    Sig: Take 1 tablet by mouth once daily     Cardiovascular: Diuretics - Thiazide Failed - 12/08/2022 10:36 AM      Failed - Cr in normal range and within 180 days    Creat  Date Value Ref Range Status  05/27/2022 0.94 0.50 - 1.03 mg/dL Final         Failed - K in normal range and within 180 days    Potassium  Date Value Ref Range Status  05/27/2022 4.5 3.5 - 5.3 mmol/L Final  03/16/2014 3.6 3.5 - 5.1 mmol/L  Final         Failed - Na in normal range and within 180 days    Sodium  Date Value Ref Range Status  05/27/2022 140 135 - 146 mmol/L Final  03/16/2014 138 136 - 145 mmol/L Final         Failed - Valid encounter within last 6 months    Recent Outpatient Visits           6 months ago Viral upper respiratory tract infection   [facility]ce Salines, FNP   6 months ago Annual physical exam   [facility] Berniece Salines, FNP   1 year ago Essential hypertension   [facility] Berniece Salines, FNP   1 year ago Primary hypertension   [facility] Caro Laroche, DO   1 year ago Acute conjunctivitis of left eye, unspecified acute conjunctivitis type   [facility] Gabriel Cirri, NP  Passed - Last BP in normal range    BP Readings from Last 1 Encounters:  05/27/22 128/84          traZODone (DESYREL) 50 MG tablet [Pharmacy Med Name: traZODone HCl 50 MG Oral Tablet] 30 tablet 0    Sig: TAKE 1/2 TO 1 (ONE-HALF TO ONE) TABLET BY MOUTH AT BEDTIME AS NEEDED FOR SLEEP     Psychiatry: Antidepressants - Serotonin Modulator Failed - 12/08/2022 10:36 AM      Failed - Valid encounter within last 6 months    Recent Outpatient Visits           6 months ago Viral upper respiratory tract infection   [facility] Berniece Salines, FNP   6 months ago Annual physical exam   [facility] Salines, FNP   1 year ago Essential hypertension   [facility] Salines, FNP   1 year ago Primary hypertension   [facility] Caro Lar[facility], DO   1 year ago Acute conjunctivitis of left eye, unspecified acute conjunctivitis type   [facility] Gabriel Cirri, NP               escitalopram (LEXAPRO) 20 MG  tablet [Pharmacy Med Name: Escitalopram Oxalate 20 MG Oral Tablet] 30 tablet 0    Sig: Take 1 tablet by mouth once daily     Psychiatry:  Antidepressants - SSRI Failed - 12/08/2022 10:36 AM      Failed - Valid encounter within last 6 months    Recent Outpatient Visits           6 months ago Viral upper respiratory tract infection   [facility] Berniece Salines, FNP   6 months ago Annual physical exam   [facility] Salines, FNP   1 year ago Essential hypertension   [facility] Bernie[facility] Salines, FNP   1 year ago Primary hypertension   [facility] Caro Laroche, DO   1 year ago Acute conjunctivitis of left eye, unspecified acute conjunctivitis type   [facility] Gabriel Cirri, NP

## 2023-01-02 NOTE — Progress Notes (Deleted)
There were no vitals taken for this visit.   Subjective:    Patient ID: Tammy Garcia, female    DOB: Jul 02, 1966, 57 y.o.   MRN: [id number]  HPI: Tammy Garcia is a 57 y.o. female  No chief complaint on file.  HTN: her blood pressure today is ***. She denies any chest pain, shortness of breath, headaches or blurred vision. She is currently on atenolol 50 mg daily and chlorthalidone 25 mg daily.   Hyperlipidemia: her last LDL was 159 on 05/27/2022. She is not currently on medications.  Recommend working on lifestyle modification by decreasing saturated fats in your diet.  The 10-year ASCVD risk score (Arnett DK, et al., 2019) is: 4.5%   Values used to calculate the score:     Age: 40 years     Sex: Female     Is Non-Hispanic African American: Yes     Diabetic: No     Tobacco smoker: No     Systolic Blood Pressure: 128 mmHg     Is BP treated: Yes     HDL Cholesterol: 68 mg/dL     Total Cholesterol: 249 mg/dL   Anxiety/insomnia: she is currently on lexapro 20 mg daily and takes trazodone 25-50 mg at bedtime as needed for sleep.         06/10/2022    9:16 AM 05/27/2022   10:04 AM 11/19/2021    9:32 AM 05/21/2021    8:34 AM 01/15/2021    9:38 AM  Depression screen PHQ 2/9  Decreased Interest 0 0 0 0 0  Down, Depressed, Hopeless 0 0 0 0 0  PHQ - 2 Score 0 0 0 0 0  Altered sleeping  1 2 0 0  Tired, decreased energy  0 1 0 0  Change in appetite  0 2 0 0  Feeling bad or failure about yourself   0 0 0 0  Trouble concentrating  0 0 0 0  Moving slowly or fidgety/restless  0 0 0 0  Suicidal thoughts  0 0 0 0  PHQ-9 Score  1 5 0 0  Difficult doing work/chores  Not difficult at all Somewhat difficult Not difficult at all Not difficult at all      05/27/2022   10:05 AM 11/19/2021    9:33 AM 06/05/2020   10:26 AM 03/01/2019   11:29 AM  GAD 7 : Generalized Anxiety Score  Nervous, Anxious, on Edge 1 0 3 2  Control/stop worrying 1 0 0 2  Worry too much - different things 1 0 0 2   Trouble relaxing 0 0 0 2  Restless 0 0 0 2  Easily annoyed or irritable 1 1 3 2   Afraid - awful might happen 1 0 0 2  Total GAD 7 Score 5 1 6 14   Anxiety Difficulty Somewhat difficult Not difficult at all Not difficult at all Somewhat difficult   *** Hot flashes: She says she has had hot flashes ever since she stopped having her period. She says this has been going on for awhile.  She is currently on Lexapro.  Discussed starting Black cohosh.    Relevant past medical, surgical, family and social history reviewed and updated as indicated. Interim medical history since our last visit reviewed. Allergies and medications reviewed and updated.  Review of Systems  Constitutional: Negative for fever or weight change.  Respiratory: Negative for cough and shortness of breath.   Cardiovascular: Negative for chest pain or palpitations.  Gastrointestinal: Negative for  abdominal pain, no bowel changes.  Musculoskeletal: Negative for gait problem or joint swelling.  Skin: Negative for rash.  Neurological: Negative for dizziness or headache.  No other specific complaints in a complete review of systems (except as listed in HPI above).      Objective:    There were no vitals taken for this visit.  Wt Readings from Last 3 Encounters:  05/27/22 157 lb 3.2 oz (71.3 kg)  11/19/21 154 lb 14.4 oz (70.3 kg)  05/21/21 147 lb 4.8 oz (66.8 kg)    Physical Exam  Constitutional: Patient appears well-developed and well-nourished.  No distress.  HEENT: head atraumatic, normocephalic, pupils equal and reactive to light, neck supple Cardiovascular: Normal rate, regular rhythm and normal heart sounds.  No murmur heard. No BLE edema. Pulmonary/Chest: Effort normal and breath sounds normal. No respiratory distress. Abdominal: Soft.  There is no tenderness. Psychiatric: Patient has a normal mood and affect. behavior is normal. Judgment and thought content normal.  Results for orders placed or performed in  visit on 05/27/22  Lipid panel  Result Value Ref Range   Cholesterol 249 (H) <200 mg/dL   HDL 68 > OR = 50 mg/dL   Triglycerides 161 <096 mg/dL   LDL Cholesterol (Calc) 159 (H) mg/dL (calc)   Total CHOL/HDL Ratio 3.7 <5.0 (calc)   Non-HDL Cholesterol (Calc) 181 (H) <130 mg/dL (calc)  CBC with Differential/Platelet  Result Value Ref Range   WBC 7.0 3.8 - 10.8 Thousand/uL   RBC 4.09 3.80 - 5.10 Million/uL   Hemoglobin 13.4 11.7 - 15.5 g/dL   HCT 04.5 40.9 - 81.1 %   MCV 93.9 80.0 - 100.0 fL   MCH 32.8 27.0 - 33.0 pg   MCHC 34.9 32.0 - 36.0 g/dL   RDW 91.4 78.2 - 95.6 %   Platelets 315 140 - 400 Thousand/uL   MPV 9.9 7.5 - 12.5 fL   Neutro Abs 3,591 1,500 - 7,800 cells/uL   Lymphs Abs 2,618 850 - 3,900 cells/uL   Absolute Monocytes 714 200 - 950 cells/uL   Eosinophils Absolute 49 15 - 500 cells/uL   Basophils Absolute 28 0 - 200 cells/uL   Neutrophils Relative % 51.3 %   Total Lymphocyte 37.4 %   Monocytes Relative 10.2 %   Eosinophils Relative 0.7 %   Basophils Relative 0.4 %  COMPLETE METABOLIC PANEL WITH GFR  Result Value Ref Range   Glucose, Bld 82 65 - 99 mg/dL   BUN 21 7 - 25 mg/dL   Creat 2.13 0.86 - 5.78 mg/dL   eGFR 73 > OR = 60 IO/NGE/9.52W4   BUN/Creatinine Ratio SEE NOTE: 6 - 22 (calc)   Sodium 140 135 - 146 mmol/L   Potassium 4.5 3.5 - 5.3 mmol/L   Chloride 103 98 - 110 mmol/L   CO2 29 20 - 32 mmol/L   Calcium 9.9 8.6 - 10.4 mg/dL   Total Protein 7.2 6.1 - 8.1 g/dL   Albumin 4.4 3.6 - 5.1 g/dL   Globulin 2.8 1.9 - 3.7 g/dL (calc)   AG Ratio 1.6 1.0 - 2.5 (calc)   Total Bilirubin 0.3 0.2 - 1.2 mg/dL   Alkaline phosphatase (APISO) 96 37 - 153 U/L   AST 17 10 - 35 U/L   ALT 18 6 - 29 U/L  Hemoglobin A1c  Result Value Ref Range   Hgb A1c MFr Bld 5.9 (H) <5.7 % of total Hgb   Mean Plasma Glucose 123 mg/dL   eAG (mmol/L) 6.8  mmol/L      Assessment & Plan:   Problem List Items Addressed This Visit       Other   Insomnia   Anxiety   Other Visit  Diagnoses     Essential hypertension    -  Primary   Mixed hyperlipidemia            Follow up plan: No follow-ups on file.

## 2023-01-03 ENCOUNTER — Ambulatory Visit: Payer: Managed Care, Other (non HMO) | Admitting: Nurse Practitioner

## 2023-01-04 ENCOUNTER — Other Ambulatory Visit: Payer: Self-pay | Admitting: Nurse Practitioner

## 2023-01-04 DIAGNOSIS — G4700 Insomnia, unspecified: Secondary | ICD-10-CM

## 2023-01-04 DIAGNOSIS — I10 Essential (primary) hypertension: Secondary | ICD-10-CM

## 2023-01-04 DIAGNOSIS — F419 Anxiety disorder, unspecified: Secondary | ICD-10-CM

## 2023-01-04 DIAGNOSIS — N951 Menopausal and female climacteric states: Secondary | ICD-10-CM

## 2023-01-07 NOTE — Telephone Encounter (Signed)
Requested Prescriptions  Pending Prescriptions Disp Refills   atenolol (TENORMIN) 50 MG tablet [Pharmacy Med Name: Atenolol 50 MG Oral Tablet] 90 tablet 0    Sig: Take 1 tablet by mouth once daily     Cardiovascular: Beta Blockers 2 Failed - 01/04/2023  6:54 AM      Failed - Valid encounter within last 6 months    Recent Outpatient Visits           7 months ago Viral upper respiratory tract infection   [facility] Berniece Salines, FNP   7 months ago Annual physical exam   [facility], F[facility]   1 year ago Essential hypertension   [facility] Berniece Salines, FNP   1 year ago Primary hypertension   [facility] Caro Lar[facility], DO   1 year ago Acute conjunctivitis of left eye, unspecified acute conjunctivitis type   [facility] Gabriel Cirri, NP       Future Appointments             In 3 days Berniece Salines, FNP [facility]            Passed - Cr in normal range and within 360 days    Creat  Date Value Ref Range Status  05/27/2022 0.94 0.50 - 1.03 mg/dL Final         Passed - Last BP in normal range    BP Readings from Last 1 Encounters:  05/27/22 128/84         Passed - Last Heart Rate in normal range    Pulse Readings from Last 1 Encounters:  05/27/22 73          escitalopram (LEXAPRO) 20 MG tablet [Pharmacy Med Name: Escitalopram Oxalate 20 MG Oral Tablet] 90 tablet 0    Sig: Take 1 tablet by mouth once daily     Psychiatry:  Antidepressants - SSRI Failed - 01/04/2023  6:54 AM      Failed - Valid encounter within last 6 months    Recent Outpatient Visits           7 months ago Viral upper respiratory tract infection   [facility] Berniece Salines, FNP   7 months ago Annual physical exam   [facility] Berniece Salines, FNP    1 year ago Essential hypertension   [facility] Berniece Salines, FNP   1 year ago Primary hypertension   [facility] Caro Laroche, DO   1 year ago Acute conjunctivitis of left eye, unspecified acute conjunctivitis type   [facility] Gabriel Cirri, NP       Future Appointments             In 3 days Bernie[facility] Salines, FNP [facility]             chlorthalidone (HYGROTON) 25 MG tablet [Pharmacy Med Name: Chlorthalidone 25 MG Oral Tablet] 90 tablet 0    Sig: Take 1 tablet by mouth once daily     Cardiovascular: Diuretics - Thiazide Failed - 01/04/2023  6:54 AM      Failed - Cr in normal range and within 180 days    Creat  Date Value Ref Range Status  05/27/2022 0.94 0.50 -  1.03 mg/dL Final         Failed - K in normal range and within 180 days    Potassium  Date Value Ref Range Status  05/27/2022 4.5 3.5 - 5.3 mmol/L Final  03/16/2014 3.6 3.5 - 5.1 mmol/L Final         Failed - Na in normal range and within 180 days    Sodium  Date Value Ref Range Status  05/27/2022 140 135 - 146 mmol/L Final  03/16/2014 138 136 - 145 mmol/L Final         Failed - Valid encounter within last 6 months    Recent Outpatient Visits           7 months ago Viral upper respiratory tract infection   [facility] Berniece Salines, FNP   7 months ago Annual physical exam   [facility] Bernie[facility] Salines, FNP   1 year ago Essential hypertension   [facility] Sal[facility], FNP   1 year ago Primary hypertension   [facility] Caro [facility], DO   1 year ago Acute conjunctivitis of left eye, unspecified acute conjunctivitis type   [facility] Gabriel Cirri, NP       Future Appointments             In 3 days Berniece Salines, FNP [facility]            Passed - Last BP in normal range    BP Readings from Last 1 Encounters:  05/27/22 128/84

## 2023-01-09 NOTE — Progress Notes (Signed)
BP 128/78   Pulse 64   Temp 97.8 F (36.6 C) (Oral)   Resp 16   Ht 5' 2.4" (1.585 m)   Wt 152 lb 11.2 oz (69.3 kg)   SpO2 99%   BMI 27.57 kg/m    Subjective:    Patient ID: Tammy Garcia, female    DOB: [birth year], 56 y.o.   MRN: [id number]  HPI: Tammy Garcia is a 57 y.o. female  Chief Complaint  Patient presents with   Hypertension    6 month follow up   Hyperlipidemia   Anxiety   Depression    Patient increased her medication from 20 to 40 mg   HTN: her blood pressure today is 128/78. She denies any chest pain, shortness of breath, headaches or blurred vision. She is currently on atenolol 50 mg daily and chlorthalidone 25 mg daily.   Prediabetes: her last A1C was 5.9 on 05/27/2022. Recommend working on lifestyle modification. Will check labs today.  Hyperlipidemia: her last LDL was 159 on 05/27/2022. She is not currently on medications.  Recommend working on lifestyle modification by decreasing saturated fats in your diet.  The 10-year ASCVD risk score (Arnett DK, et al., 2019) is: 4.5%   Values used to calculate the score:     Age: 69 years     Sex: Female     Is Non-Hispanic African American: Yes     Diabetic: No     Tobacco smoker: No     Systolic Blood Pressure: 128 mmHg     Is BP treated: Yes     HDL Cholesterol: 68 mg/dL     Total Cholesterol: 249 mg/dL   Depression/Anxiety/insomnia: she reports that she was really depressed after her daughter moved with her grandchildren to [place]. She says that she increased her lexapro to 40 mg daily and reports she is doing much better now. Patient reports she is tolerating it at this dose. Will continue with current treatment.  She also takes trazodone 25-50 mg at bedtime as needed for sleep.         01/10/2023   10:16 AM 06/10/2022    9:16 AM 05/27/2022   10:04 AM 11/19/2021    9:32 AM 05/21/2021    8:34 AM  Depression screen PHQ 2/9  Decreased Interest 2 0 0 0 0  Down, Depressed, Hopeless 1 0 0 0 0  PHQ  - 2 Score 3 0 0 0 0  Altered sleeping 1  1 2  0  Tired, decreased energy 0  0 1 0  Change in appetite 1  0 2 0  Feeling bad or failure about yourself  0  0 0 0  Trouble concentrating 0  0 0 0  Moving slowly or fidgety/restless 0  0 0 0  Suicidal thoughts 0  0 0 0  PHQ-9 Score 5  1 5  0  Difficult doing work/chores Somewhat difficult  Not difficult at all Somewhat difficult Not difficult at all      01/10/2023   10:17 AM 05/27/2022   10:05 AM 11/19/2021    9:33 AM 06/05/2020   10:26 AM  GAD 7 : Generalized Anxiety Score  Nervous, Anxious, on Edge 0 1 0 3  Control/stop worrying 1 1 0 0  Worry too much - different things 1 1 0 0  Trouble relaxing 1 0 0 0  Restless 0 0 0 0  Easily annoyed or irritable 0 1 1 3   Afraid - awful might happen 0 1 0  0  Total GAD 7 Score 3 5 1 6   Anxiety Difficulty Not difficult at all Somewhat difficult Not difficult at all Not difficult at all      Relevant past medical, surgical, family and social history reviewed and updated as indicated. Interim medical history since our last visit reviewed. Allergies and medications reviewed and updated.  Review of Systems  Constitutional: Negative for fever or weight change.  Respiratory: Negative for cough and shortness of breath.   Cardiovascular: Negative for chest pain or palpitations.  Gastrointestinal: Negative for abdominal pain, no bowel changes.  Musculoskeletal: Negative for gait problem or joint swelling.  Skin: Negative for rash.  Neurological: Negative for dizziness or headache.  No other specific complaints in a complete review of systems (except as listed in HPI above).      Objective:    BP 128/78   Pulse 64   Temp 97.8 F (36.6 C) (Oral)   Resp 16   Ht 5' 2.4" (1.585 m)   Wt 152 lb 11.2 oz (69.3 kg)   SpO2 99%   BMI 27.57 kg/m   Wt Readings from Last 3 Encounters:  01/10/23 152 lb 11.2 oz (69.3 kg)  05/27/22 157 lb 3.2 oz (71.3 kg)  11/19/21 154 lb 14.4 oz (70.3 kg)    Physical  Exam  Constitutional: Patient appears well-developed and well-nourished.  No distress.  HEENT: head atraumatic, normocephalic, pupils equal and reactive to light, neck supple Cardiovascular: Normal rate, regular rhythm and normal heart sounds.  No murmur heard. No BLE edema. Pulmonary/Chest: Effort normal and breath sounds normal. No respiratory distress. Abdominal: Soft.  There is no tenderness. Psychiatric: Patient has a normal mood and affect. behavior is normal. Judgment and thought content normal.  Results for orders placed or performed in visit on 05/27/22  Lipid panel  Result Value Ref Range   Cholesterol 249 (H) <200 mg/dL   HDL 68 > OR = 50 mg/dL   Triglycerides 409 <811 mg/dL   LDL Cholesterol (Calc) 159 (H) mg/dL (calc)   Total CHOL/HDL Ratio 3.7 <5.0 (calc)   Non-HDL Cholesterol (Calc) 181 (H) <130 mg/dL (calc)  CBC with Differential/Platelet  Result Value Ref Range   WBC 7.0 3.8 - 10.8 Thousand/uL   RBC 4.09 3.80 - 5.10 Million/uL   Hemoglobin 13.4 11.7 - 15.5 g/dL   HCT 91.4 78.2 - 95.6 %   MCV 93.9 80.0 - 100.0 fL   MCH 32.8 27.0 - 33.0 pg   MCHC 34.9 32.0 - 36.0 g/dL   RDW 21.3 08.6 - 57.8 %   Platelets 315 140 - 400 Thousand/uL   MPV 9.9 7.5 - 12.5 fL   Neutro Abs 3,591 1,500 - 7,800 cells/uL   Lymphs Abs 2,618 850 - 3,900 cells/uL   Absolute Monocytes 714 200 - 950 cells/uL   Eosinophils Absolute 49 15 - 500 cells/uL   Basophils Absolute 28 0 - 200 cells/uL   Neutrophils Relative % 51.3 %   Total Lymphocyte 37.4 %   Monocytes Relative 10.2 %   Eosinophils Relative 0.7 %   Basophils Relative 0.4 %  COMPLETE METABOLIC PANEL WITH GFR  Result Value Ref Range   Glucose, Bld 82 65 - 99 mg/dL   BUN 21 7 - 25 mg/dL   Creat 4.69 6.29 - 5.28 mg/dL   eGFR 73 > OR = 60 UX/LKG/4.01U2   BUN/Creatinine Ratio SEE NOTE: 6 - 22 (calc)   Sodium 140 135 - 146 mmol/L   Potassium 4.5 3.5 -  5.3 mmol/L   Chloride 103 98 - 110 mmol/L   CO2 29 20 - 32 mmol/L   Calcium 9.9  8.6 - 10.4 mg/dL   Total Protein 7.2 6.1 - 8.1 g/dL   Albumin 4.4 3.6 - 5.1 g/dL   Globulin 2.8 1.9 - 3.7 g/dL (calc)   AG Ratio 1.6 1.0 - 2.5 (calc)   Total Bilirubin 0.3 0.2 - 1.2 mg/dL   Alkaline phosphatase (APISO) 96 37 - 153 U/L   AST 17 10 - 35 U/L   ALT 18 6 - 29 U/L  Hemoglobin A1c  Result Value Ref Range   Hgb A1c MFr Bld 5.9 (H) <5.7 % of total Hgb   Mean Plasma Glucose 123 mg/dL   eAG (mmol/L) 6.8 mmol/L      Assessment & Plan:   Problem List Items Addressed This Visit       Cardiovascular and Mediastinum   Essential hypertension - Primary     She is currently on atenolol 50 mg daily and chlorthalidone 25 mg daily.       Relevant Medications   chlorthalidone (HYGROTON) 25 MG tablet   atenolol (TENORMIN) 50 MG tablet   Other Relevant Orders   CBC with Differential/Platelet   COMPLETE METABOLIC PANEL WITH GFR     Other   Insomnia    She also takes trazodone 25-50 mg at bedtime as needed for sleep.          Relevant Medications   escitalopram (LEXAPRO) 20 MG tablet   traZODone (DESYREL) 50 MG tablet   Anxiety    she reports that she was really depressed after her daughter moved with her grandchildren to [place]. She says that she increased her lexapro to 40 mg daily and reports she is doing much better now      Relevant Medications   escitalopram (LEXAPRO) 20 MG tablet   traZODone (DESYREL) 50 MG tablet   Mixed hyperlipidemia    Working on lifestyle modification, will get labs today      Relevant Medications   chlorthalidone (HYGROTON) 25 MG tablet   atenolol (TENORMIN) 50 MG tablet   Other Relevant Orders   Lipid panel   Prediabetes    Working on lifestyle modification, will get labs today      Relevant Orders   COMPLETE METABOLIC PANEL WITH GFR   Hemoglobin A1c   Mild episode of recurrent major depressive disorder [facility])    she reports that she was really depressed after her daughter moved with her grandchildren to [place]. She  says that she increased her lexapro to 40 mg daily and reports she is doing much better now      Relevant Medications   escitalopram (LEXAPRO) 20 MG tablet   traZODone (DESYREL) 50 MG tablet   Other Visit Diagnoses     Screening for diabetes mellitus       Relevant Orders   COMPLETE METABOLIC PANEL WITH GFR   Hemoglobin A1c        Follow up plan: Return in about 6 months (around 07/12/2023) for follow up.

## 2023-01-10 ENCOUNTER — Ambulatory Visit (INDEPENDENT_AMBULATORY_CARE_PROVIDER_SITE_OTHER): Payer: Managed Care, Other (non HMO) | Admitting: Nurse Practitioner

## 2023-01-10 ENCOUNTER — Encounter: Payer: Self-pay | Admitting: Nurse Practitioner

## 2023-01-10 ENCOUNTER — Other Ambulatory Visit: Payer: Self-pay

## 2023-01-10 VITALS — BP 128/78 | HR 64 | Temp 97.8°F | Resp 16 | Ht 62.4 in | Wt 152.7 lb

## 2023-01-10 DIAGNOSIS — Z131 Encounter for screening for diabetes mellitus: Secondary | ICD-10-CM

## 2023-01-10 DIAGNOSIS — F330 Major depressive disorder, recurrent, mild: Secondary | ICD-10-CM | POA: Insufficient documentation

## 2023-01-10 DIAGNOSIS — G4700 Insomnia, unspecified: Secondary | ICD-10-CM | POA: Diagnosis not present

## 2023-01-10 DIAGNOSIS — E782 Mixed hyperlipidemia: Secondary | ICD-10-CM | POA: Insufficient documentation

## 2023-01-10 DIAGNOSIS — F419 Anxiety disorder, unspecified: Secondary | ICD-10-CM | POA: Diagnosis not present

## 2023-01-10 DIAGNOSIS — I10 Essential (primary) hypertension: Secondary | ICD-10-CM | POA: Diagnosis not present

## 2023-01-10 DIAGNOSIS — R7303 Prediabetes: Secondary | ICD-10-CM | POA: Insufficient documentation

## 2023-01-10 LAB — CBC WITH DIFFERENTIAL/PLATELET
Hemoglobin: 14 g/dL (ref 11.7–15.5)
MCHC: 33.9 g/dL (ref 32.0–36.0)
Platelets: 304 10*3/uL (ref 140–400)
WBC: 6.9 10*3/uL (ref 3.8–10.8)

## 2023-01-10 MED ORDER — ATENOLOL 50 MG PO TABS: 50.0000 mg | ORAL_TABLET | Freq: Every day | ORAL | 1 refills | Status: DC

## 2023-01-10 MED ORDER — ESCITALOPRAM OXALATE 20 MG PO TABS: 40.0000 mg | ORAL_TABLET | Freq: Every day | ORAL | 1 refills | Status: DC

## 2023-01-10 MED ORDER — TRAZODONE HCL 50 MG PO TABS
25.0000 mg | ORAL_TABLET | Freq: Every evening | ORAL | 1 refills | Status: DC | PRN
Start: 2023-01-10 — End: 2023-08-20

## 2023-01-10 MED ORDER — CHLORTHALIDONE 25 MG PO TABS: 25.0000 mg | ORAL_TABLET | Freq: Every day | ORAL | 1 refills | Status: DC

## 2023-01-10 NOTE — Assessment & Plan Note (Signed)
She also takes trazodone 25-50 mg at bedtime as needed for sleep.

## 2023-01-10 NOTE — Assessment & Plan Note (Signed)
She is currently on atenolol 50 mg daily and chlorthalidone 25 mg daily.

## 2023-01-10 NOTE — Assessment & Plan Note (Signed)
she reports that she was really depressed after her daughter moved with her grandchildren to [place]. She says that she increased her lexapro to 40 mg daily and reports she is doing much better now 

## 2023-01-10 NOTE — Assessment & Plan Note (Signed)
Working on lifestyle modification, will get labs today 

## 2023-01-10 NOTE — Assessment & Plan Note (Signed)
Working on lifestyle modification, will get labs today

## 2023-01-10 NOTE — Assessment & Plan Note (Signed)
she reports that she was really depressed after her daughter moved with her grandchildren to [place]. She says that she increased her lexapro to 40 mg daily and reports she is doing much better now

## 2023-01-11 LAB — CBC WITH DIFFERENTIAL/PLATELET
Absolute Monocytes: 759 cells/uL (ref 200–950)
Basophils Absolute: 28 cells/uL (ref 0–200)
Basophils Relative: 0.4 %
Eosinophils Absolute: 21 cells/uL (ref 15–500)
Eosinophils Relative: 0.3 %
HCT: 41.3 % (ref 35.0–45.0)
Lymphs Abs: 2829 cells/uL (ref 850–3900)
MCH: 31.7 pg (ref 27.0–33.0)
MCV: 93.4 fL (ref 80.0–100.0)
MPV: 9.9 fL (ref 7.5–12.5)
Monocytes Relative: 11 %
Neutro Abs: 3264 cells/uL (ref 1500–7800)
Neutrophils Relative %: 47.3 %
RBC: 4.42 10*6/uL (ref 3.80–5.10)
RDW: 13.8 % (ref 11.0–15.0)
Total Lymphocyte: 41 %

## 2023-01-11 LAB — COMPLETE METABOLIC PANEL WITH GFR
AG Ratio: 1.6 (calc) (ref 1.0–2.5)
ALT: 19 U/L (ref 6–29)
AST: 22 U/L (ref 10–35)
Albumin: 4.6 g/dL (ref 3.6–5.1)
Alkaline phosphatase (APISO): 97 U/L (ref 37–153)
BUN: 14 mg/dL (ref 7–25)
CO2: 29 mmol/L (ref 20–32)
Calcium: 10 mg/dL (ref 8.6–10.4)
Chloride: 99 mmol/L (ref 98–110)
Creat: 0.8 mg/dL (ref 0.50–1.03)
Globulin: 2.8 g/dL (calc) (ref 1.9–3.7)
Glucose, Bld: 81 mg/dL (ref 65–99)
Potassium: 4.2 mmol/L (ref 3.5–5.3)
Sodium: 138 mmol/L (ref 135–146)
Total Bilirubin: 0.5 mg/dL (ref 0.2–1.2)
Total Protein: 7.4 g/dL (ref 6.1–8.1)
eGFR: 88 mL/min/{1.73_m2} (ref >=60)

## 2023-01-11 LAB — LIPID PANEL
Cholesterol: 259 mg/dL — ABNORMAL HIGH (ref <200)
HDL: 64 mg/dL (ref >=50)
LDL Cholesterol (Calc): 160 mg/dL (calc) — ABNORMAL HIGH
Non-HDL Cholesterol (Calc): 195 mg/dL (calc) — ABNORMAL HIGH (ref <130)
Total CHOL/HDL Ratio: 4 (calc) (ref <5.0)
Triglycerides: 195 mg/dL — ABNORMAL HIGH (ref <150)

## 2023-01-11 LAB — HEMOGLOBIN A1C
Hgb A1c MFr Bld: 5.7 % of total Hgb — ABNORMAL HIGH (ref <5.7)
Mean Plasma Glucose: 117 mg/dL
eAG (mmol/L): 6.5 mmol/L

## 2023-07-04 ENCOUNTER — Ambulatory Visit: Payer: Managed Care, Other (non HMO) | Admitting: Nurse Practitioner

## 2023-07-04 NOTE — Progress Notes (Deleted)
There were no vitals taken for this visit.   Subjective:    Patient ID: Tammy Garcia, female    DOB: [birth year], 57 y.o.   MRN: [id number]  HPI: Tammy Garcia is a 57 y.o. female  No chief complaint on file.   Relevant past medical, surgical, family and social history reviewed and updated as indicated. Interim medical history since our last visit reviewed. Allergies and medications reviewed and updated.  Review of Systems Constitutional: Negative for fever or weight change.  Respiratory: Negative for cough and shortness of breath.   Cardiovascular: Negative for chest pain or palpitations.  Gastrointestinal: Negative for abdominal pain, no bowel changes.  Musculoskeletal: Negative for gait problem or joint swelling.  Skin: Negative for rash.  Neurological: Negative for dizziness or headache.  No other specific complaints in a complete review of systems (except as listed in HPI above).      Objective:    There were no vitals taken for this visit.  Wt Readings from Last 3 Encounters:  01/10/23 152 lb 11.2 oz (69.3 kg)  05/27/22 157 lb 3.2 oz (71.3 kg)  11/19/21 154 lb 14.4 oz (70.3 kg)    Physical Exam  Constitutional: Patient appears well-developed and well-nourished. Obese *** No distress.  HEENT: head atraumatic, normocephalic, pupils equal and reactive to light, ears ***, neck supple, throat within normal limits Cardiovascular: Normal rate, regular rhythm and normal heart sounds.  No murmur heard. No BLE edema. Pulmonary/Chest: Effort normal and breath sounds normal. No respiratory distress. Abdominal: Soft.  There is no tenderness. Psychiatric: Patient has a normal mood and affect. behavior is normal. Judgment and thought content normal.  Results for orders placed or performed in visit on 01/10/23  CBC with Differential/Platelet  Result Value Ref Range   WBC 6.9 3.8 - 10.8 Thousand/uL   RBC 4.42 3.80 - 5.10 Million/uL   Hemoglobin 14.0 11.7 - 15.5 g/dL   HCT 78.2  95.6 - 21.3 %   MCV 93.4 80.0 - 100.0 fL   MCH 31.7 27.0 - 33.0 pg   MCHC 33.9 32.0 - 36.0 g/dL   RDW 08.6 57.8 - 46.9 %   Platelets 304 140 - 400 Thousand/uL   MPV 9.9 7.5 - 12.5 fL   Neutro Abs 3,264 1,500 - 7,800 cells/uL   Lymphs Abs 2,829 850 - 3,900 cells/uL   Absolute Monocytes 759 200 - 950 cells/uL   Eosinophils Absolute 21 15 - 500 cells/uL   Basophils Absolute 28 0 - 200 cells/uL   Neutrophils Relative % 47.3 %   Total Lymphocyte 41.0 %   Monocytes Relative 11.0 %   Eosinophils Relative 0.3 %   Basophils Relative 0.4 %  COMPLETE METABOLIC PANEL WITH GFR  Result Value Ref Range   Glucose, Bld 81 65 - 99 mg/dL   BUN 14 7 - 25 mg/dL   Creat 6.29 5.28 - 4.13 mg/dL   eGFR 88 > OR = 60 KG/MWN/0.27O5   BUN/Creatinine Ratio SEE NOTE: 6 - 22 (calc)   Sodium 138 135 - 146 mmol/L   Potassium 4.2 3.5 - 5.3 mmol/L   Chloride 99 98 - 110 mmol/L   CO2 29 20 - 32 mmol/L   Calcium 10.0 8.6 - 10.4 mg/dL   Total Protein 7.4 6.1 - 8.1 g/dL   Albumin 4.6 3.6 - 5.1 g/dL   Globulin 2.8 1.9 - 3.7 g/dL (calc)   AG Ratio 1.6 1.0 - 2.5 (calc)   Total Bilirubin 0.5 0.2 - 1.2 mg/dL  Alkaline phosphatase (APISO) 97 37 - 153 U/L   AST 22 10 - 35 U/L   ALT 19 6 - 29 U/L  Lipid panel  Result Value Ref Range   Cholesterol 259 (H) <200 mg/dL   HDL 64 > OR = 50 mg/dL   Triglycerides 161 (H) <150 mg/dL   LDL Cholesterol (Calc) 160 (H) mg/dL (calc)   Total CHOL/HDL Ratio 4.0 <5.0 (calc)   Non-HDL Cholesterol (Calc) 195 (H) <130 mg/dL (calc)  Hemoglobin W9U  Result Value Ref Range   Hgb A1c MFr Bld 5.7 (H) <5.7 % of total Hgb   Mean Plasma Glucose 117 mg/dL   eAG (mmol/L) 6.5 mmol/L      Assessment & Plan:   Problem List Items Addressed This Visit   None    Follow up plan: No follow-ups on file.

## 2023-07-18 ENCOUNTER — Ambulatory Visit: Payer: Managed Care, Other (non HMO) | Admitting: Nurse Practitioner

## 2023-07-18 NOTE — Progress Notes (Unsigned)
There were no vitals taken for this visit.   Subjective:    Patient ID: Tammy Garcia, female    DOB: 15-Apr-1966, 57 y.o.   MRN: [id number]  HPI: Tammy Garcia is a 57 y.o. female  No chief complaint on file.  HTN: her blood pressure today is 128/78. She denies any chest pain, shortness of breath, headaches or blurred vision. She is currently on atenolol 50 mg daily and chlorthalidone 25 mg daily.   Prediabetes: her last A1C was 5.9 on 05/27/2022. Recommend working on lifestyle modification. Will check labs today.  Hyperlipidemia: her last LDL was 159 on 05/27/2022. She is not currently on medications.  Recommend working on lifestyle modification by decreasing saturated fats in your diet.  The 10-year ASCVD risk score (Arnett DK, et al., 2019) is: 5%   Values used to calculate the score:     Age: 72 years     Sex: Female     Is Non-Hispanic African American: Yes     Diabetic: No     Tobacco smoker: No     Systolic Blood Pressure: 128 mmHg     Is BP treated: Yes     HDL Cholesterol: 64 mg/dL     Total Cholesterol: 259 mg/dL   Depression/Anxiety/insomnia: she reports that she was really depressed after her daughter moved with her grandchildren to [place]. She says that she increased her lexapro to 40 mg daily and reports she is doing much better now. Patient reports she is tolerating it at this dose. Will continue with current treatment.  She also takes trazodone 25-50 mg at bedtime as needed for sleep.         01/10/2023   10:16 AM 06/10/2022    9:16 AM 05/27/2022   10:04 AM 11/19/2021    9:32 AM 05/21/2021    8:34 AM  Depression screen PHQ 2/9  Decreased Interest 2 0 0 0 0  Down, Depressed, Hopeless 1 0 0 0 0  PHQ - 2 Score 3 0 0 0 0  Altered sleeping 1  1 2  0  Tired, decreased energy 0  0 1 0  Change in appetite 1  0 2 0  Feeling bad or failure about yourself  0  0 0 0  Trouble concentrating 0  0 0 0  Moving slowly or fidgety/restless 0  0 0 0  Suicidal  thoughts 0  0 0 0  PHQ-9 Score 5  1 5  0  Difficult doing work/chores Somewhat difficult  Not difficult at all Somewhat difficult Not difficult at all      01/10/2023   10:17 AM 05/27/2022   10:05 AM 11/19/2021    9:33 AM 06/05/2020   10:26 AM  GAD 7 : Generalized Anxiety Score  Nervous, Anxious, on Edge 0 1 0 3  Control/stop worrying 1 1 0 0  Worry too much - different things 1 1 0 0  Trouble relaxing 1 0 0 0  Restless 0 0 0 0  Easily annoyed or irritable 0 1 1 3   Afraid - awful might happen 0 1 0 0  Total GAD 7 Score 3 5 1 6   Anxiety Difficulty Not difficult at all Somewhat difficult Not difficult at all Not difficult at all      Relevant past medical, surgical, family and social history reviewed and updated as indicated. Interim medical history since our last visit reviewed. Allergies and medications reviewed and updated.  Review of Systems  Constitutional: Negative for fever or  weight change.  Respiratory: Negative for cough and shortness of breath.   Cardiovascular: Negative for chest pain or palpitations.  Gastrointestinal: Negative for abdominal pain, no bowel changes.  Musculoskeletal: Negative for gait problem or joint swelling.  Skin: Negative for rash.  Neurological: Negative for dizziness or headache.  No other specific complaints in a complete review of systems (except as listed in HPI above).      Objective:    There were no vitals taken for this visit.  Wt Readings from Last 3 Encounters:  01/10/23 152 lb 11.2 oz (69.3 kg)  05/27/22 157 lb 3.2 oz (71.3 kg)  11/19/21 154 lb 14.4 oz (70.3 kg)    Physical Exam  Constitutional: Patient appears well-developed and well-nourished.  No distress.  HEENT: head atraumatic, normocephalic, pupils equal and reactive to light, neck supple Cardiovascular: Normal rate, regular rhythm and normal heart sounds.  No murmur heard. No BLE edema. Pulmonary/Chest: Effort normal and breath sounds normal. No respiratory  distress. Abdominal: Soft.  There is no tenderness. Psychiatric: Patient has a normal mood and affect. behavior is normal. Judgment and thought content normal.  Results for orders placed or performed in visit on 01/10/23  CBC with Differential/Platelet  Result Value Ref Range   WBC 6.9 3.8 - 10.8 Thousand/uL   RBC 4.42 3.80 - 5.10 Million/uL   Hemoglobin 14.0 11.7 - 15.5 g/dL   HCT 13.0 86.5 - 78.4 %   MCV 93.4 80.0 - 100.0 fL   MCH 31.7 27.0 - 33.0 pg   MCHC 33.9 32.0 - 36.0 g/dL   RDW 69.6 29.5 - 28.4 %   Platelets 304 140 - 400 Thousand/uL   MPV 9.9 7.5 - 12.5 fL   Neutro Abs 3,264 1,500 - 7,800 cells/uL   Lymphs Abs 2,829 850 - 3,900 cells/uL   Absolute Monocytes 759 200 - 950 cells/uL   Eosinophils Absolute 21 15 - 500 cells/uL   Basophils Absolute 28 0 - 200 cells/uL   Neutrophils Relative % 47.3 %   Total Lymphocyte 41.0 %   Monocytes Relative 11.0 %   Eosinophils Relative 0.3 %   Basophils Relative 0.4 %  COMPLETE METABOLIC PANEL WITH GFR  Result Value Ref Range   Glucose, Bld 81 65 - 99 mg/dL   BUN 14 7 - 25 mg/dL   Creat 1.32 4.40 - 1.02 mg/dL   eGFR 88 > OR = 60 VO/ZDG/6.44I3   BUN/Creatinine Ratio SEE NOTE: 6 - 22 (calc)   Sodium 138 135 - 146 mmol/L   Potassium 4.2 3.5 - 5.3 mmol/L   Chloride 99 98 - 110 mmol/L   CO2 29 20 - 32 mmol/L   Calcium 10.0 8.6 - 10.4 mg/dL   Total Protein 7.4 6.1 - 8.1 g/dL   Albumin 4.6 3.6 - 5.1 g/dL   Globulin 2.8 1.9 - 3.7 g/dL (calc)   AG Ratio 1.6 1.0 - 2.5 (calc)   Total Bilirubin 0.5 0.2 - 1.2 mg/dL   Alkaline phosphatase (APISO) 97 37 - 153 U/L   AST 22 10 - 35 U/L   ALT 19 6 - 29 U/L  Lipid panel  Result Value Ref Range   Cholesterol 259 (H) <200 mg/dL   HDL 64 > OR = 50 mg/dL   Triglycerides 474 (H) <150 mg/dL   LDL Cholesterol (Calc) 160 (H) mg/dL (calc)   Total CHOL/HDL Ratio 4.0 <5.0 (calc)   Non-HDL Cholesterol (Calc) 195 (H) <130 mg/dL (calc)  Hemoglobin Q5Z  Result Value Ref Range  Hgb A1c MFr Bld 5.7  (H) <5.7 % of total Hgb   Mean Plasma Glucose 117 mg/dL   eAG (mmol/L) 6.5 mmol/L      Assessment & Plan:   Problem List Items Addressed This Visit   None     Follow up plan: No follow-ups on file.

## 2023-08-19 ENCOUNTER — Other Ambulatory Visit: Payer: Self-pay | Admitting: Nurse Practitioner

## 2023-08-19 DIAGNOSIS — G4700 Insomnia, unspecified: Secondary | ICD-10-CM

## 2023-08-19 DIAGNOSIS — F419 Anxiety disorder, unspecified: Secondary | ICD-10-CM

## 2023-08-19 DIAGNOSIS — I10 Essential (primary) hypertension: Secondary | ICD-10-CM

## 2023-08-20 NOTE — Telephone Encounter (Signed)
 Courtesy refill, left message to make appointment. Requested Prescriptions  Pending Prescriptions Disp Refills   chlorthalidone  (HYGROTON ) 25 MG tablet [Pharmacy Med Name: Chlorthalidone  25 MG Oral Tablet] 30 tablet 0    Sig: Take 1 tablet by mouth once daily     Cardiovascular: Diuretics - Thiazide Failed - 08/20/2023  8:40 AM      Failed - Cr in normal range and within 180 days    Creat  Date Value Ref Range Status  01/10/2023 0.80 0.50 - 1.03 mg/dL Final         Failed - K in normal range and within 180 days    Potassium  Date Value Ref Range Status  01/10/2023 4.2 3.5 - 5.3 mmol/L Final  03/16/2014 3.6 3.5 - 5.1 mmol/L Final         Failed - Na in normal range and within 180 days    Sodium  Date Value Ref Range Status  01/10/2023 138 135 - 146 mmol/L Final  03/16/2014 138 136 - 145 mmol/L Final         Failed - Valid encounter within last 6 months    Recent Outpatient Visits           7 months ago Essential hypertension   [facility] Gareth Mliss FALCON, FNP   1 year ago Viral upper respiratory tract infection   [facility] Gareth Mliss FALCON, FNP   1 year ago Annual physical exam   [facility] Gareth Mliss FALCON, FNP   1 year ago Essential hypertension   [facility] Gareth Mliss F, FNP   2 years ago Primary hypertension   [facility] Madelon Pizza M, DO              Passed - Last BP in normal range    BP Readings from Last 1 Encounters:  01/10/23 128/78          traZODone  (DESYREL ) 50 MG tablet [Pharmacy Med Name: traZODone  HCl 50 MG Oral Tablet] 30 tablet 0    Sig: TAKE 1/2 TO 1 (ONE-HALF TO ONE) TABLET BY MOUTH AT BEDTIME AS NEEDED FOR SLEEP     Psychiatry: Antidepressants - Serotonin Modulator Failed - 08/20/2023  8:40 AM      Failed - Valid encounter within last 6 months    Recent Outpatient Visits           7 months ago  Essential hypertension   [facility] Gareth Mliss FALCON, FNP   1 year ago Viral upper respiratory tract infection   [facility] Gareth Mliss FALCON, FNP   1 year ago Annual physical exam   [facility] Gareth Mliss FALCON, FNP   1 year ago Essential hypertension   [facility] Mliss F, FNP   2 years ago Primary hypertension   [facility] Madelon Pizza M, DO              Passed - Completed PHQ-2 or PHQ-9 in the last 360 days       atenolol  (TENORMIN ) 50 MG tablet [Pharmacy Med Name: Atenolol  50 MG Oral Tablet] 30 tablet 0    Sig: Take 1 tablet by mouth once daily     Cardiovascular: Beta Blockers 2 Failed - 08/20/2023  8:40 AM      Failed - Valid encounter within last 6  months    Recent Outpatient Visits           7 months ago Essential hypertension   [facility] Gareth Mliss FALCON, FNP   1 year ago Viral upper respiratory tract infection   [facility] Gareth Mliss FALCON, FNP   1 year ago Annual physical exam   [facility] Gareth Mliss FALCON, FNP   1 year ago Essential hypertension   [facility] Gareth Mliss FALCON, FNP   2 years ago Primary hypertension   [facility] Madelon Pizza M, DO              Passed - Cr in normal range and within 360 days    Creat  Date Value Ref Range Status  01/10/2023 0.80 0.50 - 1.03 mg/dL Final         Passed - Last BP in normal range    BP Readings from Last 1 Encounters:  01/10/23 128/78         Passed - Last Heart Rate in normal range    Pulse Readings from Last 1 Encounters:  01/10/23 64          escitalopram  (LEXAPRO ) 20 MG tablet [Pharmacy Med Name: Escitalopram  Oxalate 20 MG Oral Tablet] 60 tablet 0    Sig: Take 2 tablets by mouth once daily     Psychiatry:   Antidepressants - SSRI Failed - 08/20/2023  8:40 AM      Failed - Valid encounter within last 6 months    Recent Outpatient Visits           7 months ago Essential hypertension   [facility] Gareth Mliss FALCON, FNP   1 year ago Viral upper respiratory tract infection   [facility] Gareth Mliss FALCON, FNP   1 year ago Annual physical exam   [facility] Gareth Mliss FALCON, FNP   1 year ago Essential hypertension   [facility] Gareth Mliss FALCON, FNP   2 years ago Primary hypertension   [facility] Madelon Pizza M, DO              Passed - Completed PHQ-2 or PHQ-9 in the last 360 days

## 2023-09-23 ENCOUNTER — Other Ambulatory Visit: Payer: Self-pay | Admitting: Nurse Practitioner

## 2023-09-23 DIAGNOSIS — I10 Essential (primary) hypertension: Secondary | ICD-10-CM

## 2023-09-23 DIAGNOSIS — G4700 Insomnia, unspecified: Secondary | ICD-10-CM

## 2023-09-23 DIAGNOSIS — F419 Anxiety disorder, unspecified: Secondary | ICD-10-CM

## 2023-09-23 NOTE — Telephone Encounter (Signed)
Patient needs an office visit.

## 2023-09-24 NOTE — Telephone Encounter (Signed)
Needs an appointment.

## 2023-09-24 NOTE — Telephone Encounter (Signed)
Requested medication (s) are due for refill today: yes  Requested medication (s) are on the active medication list: yes  Last refill:  08/20/23  Future visit scheduled: no  Notes to clinic:  Unable to refill per protocol, courtesy refill already given, routing for provider approval.      Requested Prescriptions  Pending Prescriptions Disp Refills   escitalopram (LEXAPRO) 20 MG tablet [Pharmacy Med Name: Escitalopram Oxalate 20 MG Oral Tablet] 60 tablet 0    Sig: Take 2 tablets by mouth once daily     Psychiatry:  Antidepressants - SSRI Failed - 09/24/2023  8:23 AM      Failed - Valid encounter within last 6 months    Recent Outpatient Visits           8 months ago Essential hypertension   [facility] Berniece Salines, FNP   1 year ago Viral upper respiratory tract infection   [facility] Berniece Salines, FNP   1 year ago Annual physical exam   [facility] F[facility]   1 year ago Essential hypertension   [facility] Berniece Salines, FNP   2 years ago Primary hypertension   [facility] Dense M, DO              Passed - Completed PHQ-2 or PHQ-9 in the last 360 days       chlorthalidone (HYGROTON) 25 MG tablet [Pharmacy Med Name: Chlorthalidone 25 MG Oral Tablet] 30 tablet 0    Sig: Take 1 tablet by mouth once daily     Cardiovascular: Diuretics - Thiazide Failed - 09/24/2023  8:23 AM      Failed - Cr in normal range and within 180 days    Creat  Date Value Ref Range Status  01/10/2023 0.80 0.50 - 1.03 mg/dL Final         Failed - K in normal range and within 180 days    Potassium  Date Value Ref Range Status  01/10/2023 4.2 3.5 - 5.3 mmol/L Final  03/16/2014 3.6 3.5 - 5.1 mmol/L Final         Failed - Na in normal range and within 180 days    Sodium  Date Value Ref Range Status  01/10/2023 138 135 - 146 mmol/L  Final  03/16/2014 138 136 - 145 mmol/L Final         Failed - Valid encounter within last 6 months    Recent Outpatient Visits           8 months ago Essential hypertension   [facility] Berniece Salines, FNP   1 year ago Viral upper respiratory tract infection   [facility] Berniece Salines, FNP   1 year ago Annual physical exam   [facility] Berniece Salines, FNP   1 year ago Essential hypertension   [facility] Berniece Salines, FNP   2 years ago Primary hypertension   [facility] Dense M, DO              Passed - Last BP in normal range    BP Readings from Last 1 Encounters:  01/10/23 128/78          atenolol (TENORMIN) 50 MG tablet [Pharmacy Med Name: Atenolol 50 MG Oral Tablet] 30 tablet 0  Sig: Take 1 tablet by mouth once daily     Cardiovascular: Beta Blockers 2 Failed - 09/24/2023  8:23 AM      Failed - Valid encounter within last 6 months    Recent Outpatient Visits           8 months ago Essential hypertension   [facility] Berniece Salines, FNP   1 year ago Viral upper respiratory tract infection   [facility] Berniece Salines, FNP   1 year ago Annual physical exam   [facility] Berniece Salines, FNP   1 year ago Essential hypertension   [facility] Berniece Salines, FNP   2 years ago Primary hypertension   [facility] Dense M, DO              Passed - Cr in normal range and within 360 days    Creat  Date Value Ref Range Status  01/10/2023 0.80 0.50 - 1.03 mg/dL Final         Passed - Last BP in normal range    BP Readings from Last 1 Encounters:  01/10/23 128/78         Passed - Last Heart Rate in normal range    Pulse Readings from Last 1 Encounters:   01/10/23 64          traZODone (DESYREL) 50 MG tablet [Pharmacy Med Name: traZODone HCl 50 MG Oral Tablet] 30 tablet 0    Sig: TAKE 1/2 TO 1 (ONE-HALF TO ONE) TABLET BY MOUTH AT BEDTIME AS NEEDED FOR SLEEP     Psychiatry: Antidepressants - Serotonin Modulator Failed - 09/24/2023  8:23 AM      Failed - Valid encounter within last 6 months    Recent Outpatient Visits           8 months ago Essential hypertension   [facility] Berniece Salines, FNP   1 year ago Viral upper respiratory tract infection   [facility] Berniece Salines, FNP   1 year ago Annual physical exam   [facility] Berniece Salines, FNP   1 year ago Essential hypertension   [facility] Berniece Salines, FNP   2 years ago Primary hypertension   [facility] Dense M, DO              Passed - Completed PHQ-2 or PHQ-9 in the last 360 days

## 2023-11-07 ENCOUNTER — Other Ambulatory Visit: Payer: Self-pay | Admitting: Nurse Practitioner

## 2023-11-07 DIAGNOSIS — G4700 Insomnia, unspecified: Secondary | ICD-10-CM

## 2023-11-07 DIAGNOSIS — F419 Anxiety disorder, unspecified: Secondary | ICD-10-CM

## 2023-11-08 ENCOUNTER — Other Ambulatory Visit: Payer: Self-pay | Admitting: Nurse Practitioner

## 2023-11-08 DIAGNOSIS — G4700 Insomnia, unspecified: Secondary | ICD-10-CM

## 2023-11-08 DIAGNOSIS — F419 Anxiety disorder, unspecified: Secondary | ICD-10-CM

## 2023-11-08 DIAGNOSIS — I10 Essential (primary) hypertension: Secondary | ICD-10-CM

## 2023-11-10 NOTE — Telephone Encounter (Signed)
 Requested medication (s) are due for refill today: yes  Requested medication (s) are on the active medication list: yes  Last refill:  09/24/23 #30  Future visit scheduled: no  Notes to clinic:  pt overdue for appt. Called pt and LM on VM to call back. Last refillwas a courtesy refill.    Requested Prescriptions  Pending Prescriptions Disp Refills   traZODone (DESYREL) 50 MG tablet [Pharmacy Med Name: traZODone HCl 50 MG Oral Tablet] 30 tablet 0    Sig: TAKE ONE-HALF TO ONE TABLET BY MOUTH AT BEDTIME AS NEEDED FOR SLEEP     Psychiatry: Antidepressants - Serotonin Modulator Failed - 11/10/2023 12:03 PM      Failed - Valid encounter within last 6 months    Recent Outpatient Visits   None            Passed - Completed PHQ-2 or PHQ-9 in the last 360 days

## 2023-11-10 NOTE — Telephone Encounter (Signed)
 Requested medications are due for refill today.  yes  Requested medications are on the active medications list.  yes  Last refill. All 3 09/24/2023 30 day supply.  Future visit scheduled.   no  Notes to clinic.  Pt last seen 01/10/2023.  Courtesy refill already given .     Requested Prescriptions  Pending Prescriptions Disp Refills   escitalopram (LEXAPRO) 20 MG tablet [Pharmacy Med Name: Escitalopram Oxalate 20 MG Oral Tablet] 60 tablet 0    Sig: Take 2 tablets by mouth once daily     Psychiatry:  Antidepressants - SSRI Failed - 11/10/2023  5:23 PM      Failed - Valid encounter within last 6 months    Recent Outpatient Visits   None            Passed - Completed PHQ-2 or PHQ-9 in the last 360 days       atenolol (TENORMIN) 50 MG tablet [Pharmacy Med Name: Atenolol 50 MG Oral Tablet] 30 tablet 0    Sig: Take 1 tablet by mouth once daily     Cardiovascular: Beta Blockers 2 Failed - 11/10/2023  5:23 PM      Failed - Valid encounter within last 6 months    Recent Outpatient Visits   None            Passed - Cr in normal range and within 360 days    Creat  Date Value Ref Range Status  01/10/2023 0.80 0.50 - 1.03 mg/dL Final         Passed - Last BP in normal range    BP Readings from Last 1 Encounters:  01/10/23 128/78         Passed - Last Heart Rate in normal range    Pulse Readings from Last 1 Encounters:  01/10/23 64          chlorthalidone (HYGROTON) 25 MG tablet [Pharmacy Med Name: Chlorthalidone 25 MG Oral Tablet] 30 tablet 0    Sig: Take 1 tablet by mouth once daily     Cardiovascular: Diuretics - Thiazide Failed - 11/10/2023  5:23 PM      Failed - Cr in normal range and within 180 days    Creat  Date Value Ref Range Status  01/10/2023 0.80 0.50 - 1.03 mg/dL Final         Failed - K in normal range and within 180 days    Potassium  Date Value Ref Range Status  01/10/2023 4.2 3.5 - 5.3 mmol/L Final  03/16/2014 3.6 3.5 - 5.1 mmol/L Final          Failed - Na in normal range and within 180 days    Sodium  Date Value Ref Range Status  01/10/2023 138 135 - 146 mmol/L Final  03/16/2014 138 136 - 145 mmol/L Final         Failed - Valid encounter within last 6 months    Recent Outpatient Visits   None            Passed - Last BP in normal range    BP Readings from Last 1 Encounters:  01/10/23 128/78

## 2023-11-10 NOTE — Telephone Encounter (Signed)
 Requested medication (s) are due for refill today: Yes  Requested medication (s) are on the active medication list: Yes  Last refill:  09/24/23  Future visit scheduled: No  Notes to clinic:  Unable to leave message to make appointment.    Requested Prescriptions  Pending Prescriptions Disp Refills   traZODone (DESYREL) 50 MG tablet [Pharmacy Med Name: traZODone HCl 50 MG Oral Tablet] 30 tablet 0    Sig: TAKE ONE-HALF TO ONE TABLET BY MOUTH AT BEDTIME AS NEEDED FOR SLEEP     Psychiatry: Antidepressants - Serotonin Modulator Failed - 11/10/2023 12:05 PM      Failed - Valid encounter within last 6 months    Recent Outpatient Visits   None            Passed - Completed PHQ-2 or PHQ-9 in the last 360 days

## 2023-11-11 NOTE — Telephone Encounter (Signed)
 Needs appt

## 2023-11-11 NOTE — Telephone Encounter (Signed)
 Appt sch'd for 4/4

## 2023-11-13 NOTE — Progress Notes (Signed)
 "  BP 128/78   Pulse 64   Temp 97.8 F (36.6 C)   Resp 18   Ht 5' 2.4 (1.585 m)   Wt 155 lb 6.4 oz (70.5 kg)   SpO2 98%   BMI 28.06 kg/m    Subjective:    Patient ID: Tammy Garcia, female    DOB: [birth year], 58 y.o.   MRN: [id number]  HPI: Tammy Garcia is a 58 y.o. female  Chief Complaint  Patient presents with   Medical Management of Chronic Issues   Medication Refill    Discussed the use of AI scribe software for clinical note transcription with the patient, who gave verbal consent to proceed.  History of Present Illness Tammy Garcia is a 58 year old female who presents for routine follow-up.  Anxiety and depression are well managed with her current medication regimen. She takes Lexapro  daily, which effectively controls her symptoms. No changes in her mental health status have been reported since her last visit.  She experiences chronic insomnia, which is managed with trazodone . She takes two tablets at bedtime, which helps with her sleep. No changes in her sleep pattern have been noted.  She is bothered by the yellow pollen outside, which she can 'taste' and 'smell', but she does not report any specific allergic symptoms or respiratory issues related to it.  Her previous lab results indicated an A1c in the prediabetic range. She has not reported any changes in her health status since her last visit.  Her cholesterol levels were slightly elevated in previous lab results.        11/14/2023    1:20 PM 01/10/2023   10:16 AM 06/10/2022    9:16 AM  Depression screen PHQ 2/9  Decreased Interest 1 2 0  Down, Depressed, Hopeless 1 1 0  PHQ - 2 Score 2 3 0  Altered sleeping 0 1   Tired, decreased energy 1 0   Change in appetite 0 1   Feeling bad or failure about yourself  0 0   Trouble concentrating 0 0   Moving slowly or fidgety/restless 0 0   Suicidal thoughts 0 0   PHQ-9 Score 3 5   Difficult doing work/chores Not difficult at all Somewhat difficult         11/14/2023    1:13 PM 01/10/2023   10:17 AM 05/27/2022   10:05 AM 11/19/2021    9:33 AM  GAD 7 : Generalized Anxiety Score  Nervous, Anxious, on Edge 0 0 1 0  Control/stop worrying 0 1 1 0  Worry too much - different things 0 1 1 0  Trouble relaxing 0 1 0 0  Restless 0 0 0 0  Easily annoyed or irritable 1 0 1 1  Afraid - awful might happen 0 0 1 0  Total GAD 7 Score 1 3 5 1   Anxiety Difficulty Not difficult at all Not difficult at all Somewhat difficult Not difficult at all     Relevant past medical, surgical, family and social history reviewed and updated as indicated. Interim medical history since our last visit reviewed. Allergies and medications reviewed and updated.  Review of Systems  Constitutional: Negative for fever or weight change.  Respiratory: Negative for cough and shortness of breath.   Cardiovascular: Negative for chest pain or palpitations.  Gastrointestinal: Negative for abdominal pain, no bowel changes.  Musculoskeletal: Negative for gait problem or joint swelling.  Skin: Negative for rash.  Neurological: Negative for dizziness or headache.  No other  specific complaints in a complete review of systems (except as listed in HPI above).      Objective:    BP 128/78   Pulse 64   Temp 97.8 F (36.6 C)   Resp 18   Ht 5' 2.4 (1.585 m)   Wt 155 lb 6.4 oz (70.5 kg)   SpO2 98%   BMI 28.06 kg/m    Wt Readings from Last 3 Encounters:  11/14/23 155 lb 6.4 oz (70.5 kg)  01/10/23 152 lb 11.2 oz (69.3 kg)  05/27/22 157 lb 3.2 oz (71.3 kg)    Physical Exam Physical Exam VITALS: BP- 128/78 GENERAL: Alert, cooperative, well developed, no acute distress. HEENT: Normocephalic, normal oropharynx, moist mucous membranes. CHEST: Clear to auscultation bilaterally, no wheezes, rhonchi, or crackles. CARDIOVASCULAR: Normal heart rate and rhythm, S1 and S2 normal without murmurs. ABDOMEN: Soft, non-tender, non-distended, without organomegaly, normal bowel  sounds. EXTREMITIES: No cyanosis or edema. NEUROLOGICAL: Cranial nerves grossly intact, moves all extremities without gross motor or sensory deficit.   Results for orders placed or performed in visit on 01/10/23  CBC with Differential/Platelet   Collection Time: 01/10/23 11:03 AM  Result Value Ref Range   WBC 6.9 3.8 - 10.8 Thousand/uL   RBC 4.42 3.80 - 5.10 Million/uL   Hemoglobin 14.0 11.7 - 15.5 g/dL   HCT 58.6 64.9 - 54.9 %   MCV 93.4 80.0 - 100.0 fL   MCH 31.7 27.0 - 33.0 pg   MCHC 33.9 32.0 - 36.0 g/dL   RDW 86.1 88.9 - 84.9 %   Platelets 304 140 - 400 Thousand/uL   MPV 9.9 7.5 - 12.5 fL   Neutro Abs 3,264 1,500 - 7,800 cells/uL   Lymphs Abs 2,829 850 - 3,900 cells/uL   Absolute Monocytes 759 200 - 950 cells/uL   Eosinophils Absolute 21 15 - 500 cells/uL   Basophils Absolute 28 0 - 200 cells/uL   Neutrophils Relative % 47.3 %   Total Lymphocyte 41.0 %   Monocytes Relative 11.0 %   Eosinophils Relative 0.3 %   Basophils Relative 0.4 %  COMPLETE METABOLIC PANEL WITH GFR   Collection Time: 01/10/23 11:03 AM  Result Value Ref Range   Glucose, Bld 81 65 - 99 mg/dL   BUN 14 7 - 25 mg/dL   Creat 9.19 9.49 - 8.96 mg/dL   eGFR 88 > OR = 60 fO/fpw/8.26f7   BUN/Creatinine Ratio SEE NOTE: 6 - 22 (calc)   Sodium 138 135 - 146 mmol/L   Potassium 4.2 3.5 - 5.3 mmol/L   Chloride 99 98 - 110 mmol/L   CO2 29 20 - 32 mmol/L   Calcium 10.0 8.6 - 10.4 mg/dL   Total Protein 7.4 6.1 - 8.1 g/dL   Albumin 4.6 3.6 - 5.1 g/dL   Globulin 2.8 1.9 - 3.7 g/dL (calc)   AG Ratio 1.6 1.0 - 2.5 (calc)   Total Bilirubin 0.5 0.2 - 1.2 mg/dL   Alkaline phosphatase (APISO) 97 37 - 153 U/L   AST 22 10 - 35 U/L   ALT 19 6 - 29 U/L  Lipid panel   Collection Time: 01/10/23 11:03 AM  Result Value Ref Range   Cholesterol 259 (H) <200 mg/dL   HDL 64 > OR = 50 mg/dL   Triglycerides 804 (H) <150 mg/dL   LDL Cholesterol (Calc) 160 (H) mg/dL (calc)   Total CHOL/HDL Ratio 4.0 <5.0 (calc)   Non-HDL  Cholesterol (Calc) 195 (H) <130 mg/dL (calc)  Hemoglobin J8r  Collection Time: 01/10/23 11:03 AM  Result Value Ref Range   Hgb A1c MFr Bld 5.7 (H) <5.7 % of total Hgb   Mean Plasma Glucose 117 mg/dL   eAG (mmol/L) 6.5 mmol/L       Assessment & Plan:   Problem List Items Addressed This Visit       Cardiovascular and Mediastinum   Essential hypertension - Primary   Relevant Medications   chlorthalidone  (HYGROTON ) 25 MG tablet   atenolol  (TENORMIN ) 50 MG tablet   Other Relevant Orders   CBC with Differential/Platelet   Comprehensive metabolic panel with GFR     Other   Insomnia   Relevant Medications   traZODone  (DESYREL ) 100 MG tablet   escitalopram  (LEXAPRO ) 20 MG tablet   Anxiety   Relevant Medications   traZODone  (DESYREL ) 100 MG tablet   escitalopram  (LEXAPRO ) 20 MG tablet   Mixed hyperlipidemia   Relevant Medications   chlorthalidone  (HYGROTON ) 25 MG tablet   atenolol  (TENORMIN ) 50 MG tablet   Other Relevant Orders   Lipid panel   Prediabetes   Relevant Orders   Hemoglobin A1c   Mild episode of recurrent major depressive disorder (HCC)   Relevant Medications   traZODone  (DESYREL ) 100 MG tablet   escitalopram  (LEXAPRO ) 20 MG tablet   Other Visit Diagnoses       Encounter for screening mammogram for malignant neoplasm of breast       Relevant Orders   MM 3D SCREENING MAMMOGRAM BILATERAL BREAST        Assessment and Plan Assessment & Plan Insomnia Experiencing sleep difficulties and is currently taking trazodone  at bedtime. She takes two tablets per night, which suggests the current dosage may be insufficient. - Prescribe a higher dosage of trazodone  to reduce the number of tablets taken per night.  Anxiety and Depression Anxiety and depression are well managed with Lexapro . She is currently taking Lexapro  daily and reports doing well on this medication. - Continue Lexapro  as currently prescribed.  Hypertension Blood pressure is well controlled at  128/78 mmHg. She is currently taking atenolol  50 mg. - Continue atenolol  50 mg as currently prescribed.  Prediabetes A1c was in the prediabetic range during the last lab evaluation. The condition requires monitoring to prevent progression to diabetes. - Monitor A1c levels regularly.  Hyperlipidemia Cholesterol levels were elevated during the last lab evaluation. The condition requires monitoring to assess the need for intervention. - Monitor cholesterol levels regularly.    The 10-year ASCVD risk score (Arnett DK, et al., 2019) is: 5.4%   Values used to calculate the score:     Age: 29 years     Sex: Female     Is Non-Hispanic African American: Yes     Diabetic: No     Tobacco smoker: No     Systolic Blood Pressure: 128 mmHg     Is BP treated: Yes     HDL Cholesterol: 64 mg/dL     Total Cholesterol: 259 mg/dL     Follow up plan: Return in about 6 months (around 05/15/2024) for follow up.         "

## 2023-11-14 ENCOUNTER — Ambulatory Visit (INDEPENDENT_AMBULATORY_CARE_PROVIDER_SITE_OTHER): Admitting: Nurse Practitioner

## 2023-11-14 ENCOUNTER — Encounter: Payer: Self-pay | Admitting: Nurse Practitioner

## 2023-11-14 VITALS — BP 128/78 | HR 64 | Temp 97.8°F | Resp 18 | Ht 62.4 in | Wt 155.4 lb

## 2023-11-14 DIAGNOSIS — G4700 Insomnia, unspecified: Secondary | ICD-10-CM | POA: Diagnosis not present

## 2023-11-14 DIAGNOSIS — F330 Major depressive disorder, recurrent, mild: Secondary | ICD-10-CM | POA: Diagnosis not present

## 2023-11-14 DIAGNOSIS — F419 Anxiety disorder, unspecified: Secondary | ICD-10-CM

## 2023-11-14 DIAGNOSIS — I10 Essential (primary) hypertension: Secondary | ICD-10-CM

## 2023-11-14 DIAGNOSIS — E782 Mixed hyperlipidemia: Secondary | ICD-10-CM

## 2023-11-14 DIAGNOSIS — Z1231 Encounter for screening mammogram for malignant neoplasm of breast: Secondary | ICD-10-CM

## 2023-11-14 DIAGNOSIS — R7303 Prediabetes: Secondary | ICD-10-CM

## 2023-11-14 MED ORDER — CHLORTHALIDONE 25 MG PO TABS
25.0000 mg | ORAL_TABLET | Freq: Every day | ORAL | 1 refills | Status: AC
Start: 2023-11-14 — End: ?

## 2023-11-14 MED ORDER — ESCITALOPRAM OXALATE 20 MG PO TABS: 40.0000 mg | ORAL_TABLET | Freq: Every day | ORAL | 1 refills | Status: DC

## 2023-11-14 MED ORDER — TRAZODONE HCL 100 MG PO TABS: 100.0000 mg | ORAL_TABLET | Freq: Every day | ORAL | 1 refills | Status: AC

## 2023-11-14 MED ORDER — ATENOLOL 50 MG PO TABS: 50.0000 mg | ORAL_TABLET | Freq: Every day | ORAL | 1 refills | Status: AC

## 2023-11-15 LAB — CBC WITH DIFFERENTIAL/PLATELET
Absolute Lymphocytes: 3278 {cells}/uL (ref 850–3900)
Absolute Monocytes: 666 {cells}/uL (ref 200–950)
Basophils Absolute: 30 {cells}/uL (ref 0–200)
Basophils Relative: 0.4 %
Eosinophils Absolute: 22 {cells}/uL (ref 15–500)
Eosinophils Relative: 0.3 %
HCT: 39.6 % (ref 35.0–45.0)
Hemoglobin: 13.4 g/dL (ref 11.7–15.5)
MCH: 30.8 pg (ref 27.0–33.0)
MCHC: 33.8 g/dL (ref 32.0–36.0)
MCV: 91 fL (ref 80.0–100.0)
MPV: 10.2 fL (ref 7.5–12.5)
Monocytes Relative: 9 %
Neutro Abs: 3404 {cells}/uL (ref 1500–7800)
Neutrophils Relative %: 46 %
Platelets: 315 10*3/uL (ref 140–400)
RBC: 4.35 10*6/uL (ref 3.80–5.10)
RDW: 13.6 % (ref 11.0–15.0)
Total Lymphocyte: 44.3 %
WBC: 7.4 10*3/uL (ref 3.8–10.8)

## 2023-11-15 LAB — LIPID PANEL
Cholesterol: 253 mg/dL — ABNORMAL HIGH (ref <200)
HDL: 58 mg/dL (ref >=50)
LDL Cholesterol (Calc): 171 mg/dL — ABNORMAL HIGH
Non-HDL Cholesterol (Calc): 195 mg/dL — ABNORMAL HIGH (ref <130)
Total CHOL/HDL Ratio: 4.4 (calc) (ref <5.0)
Triglycerides: 114 mg/dL (ref <150)

## 2023-11-15 LAB — COMPREHENSIVE METABOLIC PANEL WITH GFR
AG Ratio: 1.4 (calc) (ref 1.0–2.5)
ALT: 17 U/L (ref 6–29)
AST: 18 U/L (ref 10–35)
Albumin: 4.4 g/dL (ref 3.6–5.1)
Alkaline phosphatase (APISO): 97 U/L (ref 37–153)
BUN: 19 mg/dL (ref 7–25)
CO2: 28 mmol/L (ref 20–32)
Calcium: 9.8 mg/dL (ref 8.6–10.4)
Chloride: 100 mmol/L (ref 98–110)
Creat: 0.89 mg/dL (ref 0.50–1.03)
Globulin: 3.2 g/dL (ref 1.9–3.7)
Glucose, Bld: 80 mg/dL (ref 65–99)
Potassium: 3.9 mmol/L (ref 3.5–5.3)
Sodium: 136 mmol/L (ref 135–146)
Total Bilirubin: 0.4 mg/dL (ref 0.2–1.2)
Total Protein: 7.6 g/dL (ref 6.1–8.1)
eGFR: 77 mL/min/{1.73_m2} (ref >=60)

## 2023-11-15 LAB — HEMOGLOBIN A1C
Hgb A1c MFr Bld: 5.9 %{Hb} — ABNORMAL HIGH (ref <5.7)
Mean Plasma Glucose: 123 mg/dL
eAG (mmol/L): 6.8 mmol/L

## 2023-11-17 ENCOUNTER — Encounter: Payer: Self-pay | Admitting: Nurse Practitioner

## 2024-01-07 ENCOUNTER — Other Ambulatory Visit: Payer: Self-pay

## 2024-01-07 DIAGNOSIS — Z111 Encounter for screening for respiratory tuberculosis: Secondary | ICD-10-CM

## 2024-01-09 LAB — QUANTIFERON-TB GOLD PLUS
Mitogen-NIL: 6.94 [IU]/mL
NIL: 0.04 [IU]/mL
QuantiFERON-TB Gold Plus: NEGATIVE
TB1-NIL: 0 [IU]/mL
TB2-NIL: 0 [IU]/mL

## 2024-01-12 ENCOUNTER — Ambulatory Visit: Payer: Self-pay | Admitting: Nurse Practitioner

## 2024-01-12 ENCOUNTER — Other Ambulatory Visit: Payer: Self-pay

## 2024-01-14 ENCOUNTER — Other Ambulatory Visit: Payer: Self-pay

## 2024-03-10 ENCOUNTER — Telehealth: Payer: Self-pay | Admitting: Pharmacy Technician

## 2024-03-10 ENCOUNTER — Encounter: Payer: Self-pay | Admitting: Nurse Practitioner

## 2024-03-10 ENCOUNTER — Other Ambulatory Visit (HOSPITAL_COMMUNITY): Payer: Self-pay

## 2024-03-10 ENCOUNTER — Other Ambulatory Visit: Payer: Self-pay | Admitting: Nurse Practitioner

## 2024-03-10 DIAGNOSIS — G4700 Insomnia, unspecified: Secondary | ICD-10-CM

## 2024-03-10 DIAGNOSIS — F419 Anxiety disorder, unspecified: Secondary | ICD-10-CM

## 2024-03-10 MED ORDER — ESCITALOPRAM OXALATE 20 MG PO TABS: 20.0000 mg | ORAL_TABLET | Freq: Every day | ORAL | 1 refills | Status: AC

## 2024-03-10 NOTE — Telephone Encounter (Signed)
 Prior Authorization form/request asks a question that requires your assistance. Please see the question below and advise accordingly. The PA will not be submitted until the necessary information is received.     I also checked [employer]ed[employer] and other resources and everything that I've researched keeps telling me it's FDA approved for max dose 20 mg for anxiety and MDD.  I may have missed something.  Thanks!

## 2024-03-10 NOTE — Telephone Encounter (Signed)
You're welcome, thank you.

## 2024-03-12 ENCOUNTER — Telehealth: Payer: Self-pay

## 2024-03-12 NOTE — Telephone Encounter (Signed)
 Spoke to patient and she finally realized they gave her a 20 mg tablet instead of (2) 10 mg tablets

## 2024-03-12 NOTE — Telephone Encounter (Signed)
 Pt states lexapro  needs a prior auth. Pt now has medicaid

## 2024-06-29 ENCOUNTER — Ambulatory Visit (INDEPENDENT_AMBULATORY_CARE_PROVIDER_SITE_OTHER): Admitting: Internal Medicine

## 2024-06-29 ENCOUNTER — Other Ambulatory Visit: Payer: Self-pay

## 2024-06-29 ENCOUNTER — Encounter: Payer: Self-pay | Admitting: Internal Medicine

## 2024-06-29 ENCOUNTER — Ambulatory Visit
Admission: RE | Admit: 2024-06-29 | Discharge: 2024-06-29 | Disposition: A | Source: Ambulatory Visit | Attending: Internal Medicine

## 2024-06-29 ENCOUNTER — Ambulatory Visit
Admission: RE | Admit: 2024-06-29 | Discharge: 2024-06-29 | Disposition: A | Discharge status: Home / Self Care | Attending: Internal Medicine | Admitting: Internal Medicine

## 2024-06-29 VITALS — BP 128/86 | HR 86 | Temp 98.1°F | Resp 16 | Ht 62.0 in | Wt 147.7 lb

## 2024-06-29 DIAGNOSIS — M25511 Pain in right shoulder: Secondary | ICD-10-CM | POA: Insufficient documentation

## 2024-06-29 DIAGNOSIS — G8929 Other chronic pain: Secondary | ICD-10-CM

## 2024-06-29 DIAGNOSIS — M62838 Other muscle spasm: Secondary | ICD-10-CM | POA: Diagnosis not present

## 2024-06-29 MED ORDER — NAPROXEN 500 MG PO TABS: 500.0000 mg | ORAL_TABLET | Freq: Two times a day (BID) | ORAL | 0 refills | Status: AC

## 2024-06-29 MED ORDER — TIZANIDINE HCL 4 MG PO TABS: 4.0000 mg | ORAL_TABLET | Freq: Every evening | ORAL | 0 refills | Status: AC | PRN

## 2024-06-29 NOTE — Progress Notes (Signed)
 Acute Office Visit  Subjective:     Patient ID: Tammy Garcia, female    DOB: 06/04/1966, 57 y.o.   MRN: [id number]  Chief Complaint  Patient presents with   Shoulder Pain    Right     Shoulder Pain  Associated symptoms include tingling.   Patient is in today for right shoulder pain.   Discussed the use of AI scribe software for clinical note transcription with the patient, who gave verbal consent to proceed.  History of Present Illness Tammy Garcia is a 58 year old female who presents with right shoulder pain and numbness in her fingers.  She has experienced right shoulder pain since earlier this year, described as a dull ache with a burning sensation, originating in the shoulder joint and radiating down the arm. The pain is persistent and worsens with movements such as lifting her arm or reaching across her body.  Numbness affects three fingers, occurring in certain positions and sometimes present upon waking. There is persistent soreness in one finger joint and occasional tingling in her hand.  She has used topical treatments like Icy Hot and lidocaine  patches, but developed a rash from the patches. Heat therapy and two extra-strength Tylenol  daily provide temporary relief. She has not tried anti-inflammatory medications.  No neck pain is present, although certain movements exacerbate her symptoms. She is concerned about a possible lump in her armpit, though its relation to her symptoms is unclear.   Review of Systems  Musculoskeletal:  Positive for joint pain.  Neurological:  Positive for tingling. Negative for weakness.        Objective:    BP 128/86 (Cuff Size: Large)   Pulse 86   Temp 98.1 F (36.7 C) (Oral)   Resp 16   Ht 5' 2 (1.575 m)   Wt 147 lb 11.2 oz (67 kg)   SpO2 99%   BMI 27.01 kg/m    Physical Exam Constitutional:      Appearance: Normal appearance.  HENT:     Head: Normocephalic and atraumatic.  Eyes:     Conjunctiva/sclera:  Conjunctivae normal.  Cardiovascular:     Rate and Rhythm: Normal rate and regular rhythm.  Pulmonary:     Effort: Pulmonary effort is normal.     Breath sounds: Normal breath sounds.  Musculoskeletal:        General: Tenderness present. No swelling.     Right shoulder: Tenderness and bony tenderness present. No swelling or crepitus. Decreased range of motion. Normal strength.     Cervical back: Tenderness present.  Skin:    General: Skin is warm and dry.     Comments: No lumps or masses palpated in area of concers  Neurological:     General: No focal deficit present.     Mental Status: She is alert. Mental status is at baseline.     Motor: No weakness.  Psychiatric:        Mood and Affect: Mood normal.        Behavior: Behavior normal.     No results found for any visits on 06/29/24.      Assessment & Plan:   Assessment & Plan Right shoulder pain with muscle spasm and possible nerve impingement Chronic right shoulder pain with muscle spasm and possible nerve impingement, presenting as a dull, burning ache radiating to the fingers, with numbness and tingling. Differential diagnosis includes muscular tightness versus nerve impingement. Muscle spasm likely causing nerve impingement. - Ordered right shoulder x-ray to evaluate  bones and joints for arthritis and joint space. - Prescribed naproxen 500 mg twice daily for 10 days with food to reduce inflammation. - Prescribed tizanidine  at bedtime to relax muscles and prevent spasms. - Advised use of heat and Voltaren gel for symptomatic relief. - Scheduled follow-up in one month to reassess symptoms and consider MRI if no improvement.  - DG Shoulder Right; Future - naproxen (NAPROSYN) 500 MG tablet; Take 1 tablet (500 mg total) by mouth 2 (two) times daily with a meal for 10 days.  Dispense: 20 tablet; Refill: 0 - tiZANidine  (ZANAFLEX ) 4 MG tablet; Take 1 tablet (4 mg total) by mouth at bedtime as needed.  Dispense: 30 tablet;  Refill: 0   Return in about 4 weeks (around 07/27/2024).  Sharyle Fischer, DO

## 2024-07-03 ENCOUNTER — Ambulatory Visit: Payer: Self-pay | Admitting: Internal Medicine

## 2024-07-27 ENCOUNTER — Ambulatory Visit: Admitting: Nurse Practitioner
